# Patient Record
Sex: Female | Born: 1980 | Race: White | Hispanic: No | Marital: Married | Smoking: Never smoker
Health system: Southern US, Community
[De-identification: ages and names within clinical notes are randomized; demographics above are authoritative.]

## PROBLEM LIST (undated history)

## (undated) ENCOUNTER — Inpatient Hospital Stay (HOSPITAL_COMMUNITY): Payer: Self-pay

## (undated) DIAGNOSIS — D069 Carcinoma in situ of cervix, unspecified: Secondary | ICD-10-CM

## (undated) DIAGNOSIS — Z3490 Encounter for supervision of normal pregnancy, unspecified, unspecified trimester: Secondary | ICD-10-CM

## (undated) DIAGNOSIS — R42 Dizziness and giddiness: Secondary | ICD-10-CM

## (undated) DIAGNOSIS — N9489 Other specified conditions associated with female genital organs and menstrual cycle: Secondary | ICD-10-CM

## (undated) DIAGNOSIS — K819 Cholecystitis, unspecified: Secondary | ICD-10-CM

## (undated) DIAGNOSIS — Z8619 Personal history of other infectious and parasitic diseases: Secondary | ICD-10-CM

## (undated) DIAGNOSIS — D649 Anemia, unspecified: Secondary | ICD-10-CM

## (undated) DIAGNOSIS — F418 Other specified anxiety disorders: Secondary | ICD-10-CM

## (undated) DIAGNOSIS — R87629 Unspecified abnormal cytological findings in specimens from vagina: Secondary | ICD-10-CM

## (undated) HISTORY — DX: Unspecified abnormal cytological findings in specimens from vagina: R87.629

## (undated) HISTORY — DX: Personal history of other infectious and parasitic diseases: Z86.19

## (undated) HISTORY — DX: Other specified conditions associated with female genital organs and menstrual cycle: N94.89

## (undated) HISTORY — PX: LEEP: SHX91

## (undated) HISTORY — DX: Carcinoma in situ of cervix, unspecified: D06.9

---

## 2003-06-24 ENCOUNTER — Other Ambulatory Visit: Admission: RE | Admit: 2003-06-24 | Discharge: 2003-06-24 | Payer: Self-pay | Admitting: Family Medicine

## 2003-08-24 ENCOUNTER — Emergency Department (HOSPITAL_COMMUNITY): Admission: EM | Admit: 2003-08-24 | Discharge: 2003-08-24 | Payer: Self-pay | Admitting: Emergency Medicine

## 2004-07-01 ENCOUNTER — Other Ambulatory Visit: Admission: RE | Admit: 2004-07-01 | Discharge: 2004-07-01 | Payer: Self-pay | Admitting: Family Medicine

## 2005-04-02 ENCOUNTER — Encounter (INDEPENDENT_AMBULATORY_CARE_PROVIDER_SITE_OTHER): Payer: Self-pay | Admitting: *Deleted

## 2005-04-02 ENCOUNTER — Ambulatory Visit (HOSPITAL_COMMUNITY): Admission: RE | Admit: 2005-04-02 | Discharge: 2005-04-02 | Payer: Self-pay | Admitting: Gastroenterology

## 2005-07-02 ENCOUNTER — Other Ambulatory Visit: Admission: RE | Admit: 2005-07-02 | Discharge: 2005-07-02 | Payer: Self-pay | Admitting: Family Medicine

## 2006-08-14 ENCOUNTER — Ambulatory Visit (HOSPITAL_COMMUNITY): Admission: RE | Admit: 2006-08-14 | Discharge: 2006-08-14 | Payer: Self-pay | Admitting: *Deleted

## 2006-08-16 DIAGNOSIS — Z3490 Encounter for supervision of normal pregnancy, unspecified, unspecified trimester: Secondary | ICD-10-CM

## 2006-08-16 HISTORY — DX: Encounter for supervision of normal pregnancy, unspecified, unspecified trimester: Z34.90

## 2006-08-16 HISTORY — PX: OTHER SURGICAL HISTORY: SHX169

## 2007-01-17 ENCOUNTER — Encounter: Admission: RE | Admit: 2007-01-17 | Discharge: 2007-01-17 | Payer: Self-pay | Admitting: Obstetrics and Gynecology

## 2007-03-23 ENCOUNTER — Inpatient Hospital Stay (HOSPITAL_COMMUNITY): Admission: AD | Admit: 2007-03-23 | Discharge: 2007-03-25 | Payer: Self-pay | Admitting: Obstetrics and Gynecology

## 2010-10-27 ENCOUNTER — Other Ambulatory Visit: Payer: Self-pay | Admitting: Obstetrics and Gynecology

## 2011-01-01 NOTE — Op Note (Signed)
NAMEUNKNOWN, FLANNIGAN NO.:  [id number]   MEDICAL RECORD NO.:  [id number]          PATIENT TYPE:  INP   LOCATION:  9128                          FACILITY:  [facility]   PHYSICIAN:  Lenoard Aden, M.D.DATE OF BIRTH:  Aug 21, 1980   DATE OF PROCEDURE:  03/23/2007  DATE OF DISCHARGE:                               OPERATIVE REPORT   INDICATION FOR OPERATIVE DELIVERY:  Maternal exhaustion.   POSTOPERATIVE DIAGNOSIS:  Maternal exhaustion.   PROCEDURE PERFORMED:  Outlet vacuum assisted vaginal delivery with a  Kiwi cup.   SURGEON:  Olivia Mackie, M.D.   ANESTHESIA:  Epidural.   ESTIMATED BLOOD LOSS:  800 cc.   COMPLICATIONS:  None.   DRAINS:  None.   COUNTS:  Correct.   CONDITION:  The patient and the baby were recovering in good condition.   DESCRIPTION OF PROCEDURE:  After being apprised of the risks and  benefits of vacuum assistance to include a small incidence of  cephalohematoma, scalp laceration, and intracranial hemorrhage, a Kiwi  cup was placed on the fetal vertex straight AO, +4 station, 4-3 poles,  no pop offs.  The patient was delivered of a full term living female  over a second degree midline laceration, Apgars of 8 and 9.  The cord  blood was collected.  The placenta was delivered spontaneously intact,  three vessel cord noted.  The cervix was without lacerations.  A second  degree midline laceration was repaired using a 3-0 Vicryl Rapide in a  standard fashion.  No cervical lacerations were noted.  An estimated blood loss of 800 cc was noted with intermittent uterine  atony noted, 40 units of pitocin placed in the mixed IV bag solution.   The patient is recovering in good condition.      Lenoard Aden, M.D.  Electronically Signed     RJT/MEDQ  D:  03/23/2007  T:  03/23/2007  Job:  [id number]   cc:   Lenoard Aden, M.D.  Fax: [phone]

## 2011-01-01 NOTE — H&P (Signed)
NAMEMAKAYLIN, Taylor Luna NO.:  [id number]   MEDICAL RECORD NO.:  [id number]          PATIENT TYPE:  INP   LOCATION:  9175                          FACILITY:  [facility]   PHYSICIAN:  Lenoard Aden, Taylor.D.DATE OF BIRTH:  Jun 26, 1981   DATE OF ADMISSION:  03/23/2007  DATE OF DISCHARGE:                              HISTORY & PHYSICAL   INDICATIONS FOR INDUCTION:  SGA with grade 3 placenta and borderline  oligohydramnios. She is a 30 year old, white female, G1, P0 at [redacted] weeks  gestation with the aforementioned issues for induction.   ALLERGIES:  She has allergies to ERYTHROMYCIN.   MEDICATIONS:  Prenatal vitamins.   She is currently a nonsmoker, nondrinker. She denies domestic or  physical violence.   Her previous history is remarkable for oral surgery, history of abnormal  Pap smear for need of colposcopy post partum.   She has a family history of diabetes, hypertension, lupus, lung cancer  and nicotine dependence.   PHYSICAL EXAMINATION:  GENERAL:  She is a well-developed, well-  nourished, white female in no acute distress.  HEENT:  Normal.  LUNGS:  Clear.  HEART:  Regular rhythm.  ABDOMEN:  Soft, gravid and nontender. Estimated fetal weight is 6  pounds. Cervix is 2-3 cm, 70%, vertex -1.  EXTREMITIES:  Reveal no cords.  NEUROLOGIC:  Nonfocal.   IMPRESSION:  1. 39-week intrauterine pregnancy.  2. History of abnormal 1-hour GTT with inability to tolerate 3-hour      GTT with normal fasting and 2-hour post prandials checked.  3. GBS positive.  4. Rh negative.  5. Borderline pelvimetry.   PLAN:  Cautious attempts at vaginal delivery.     Lenoard Aden, Taylor.D.  Electronically Signed    RJT/MEDQ  D:  03/23/2007  T:  03/23/2007  Job:  [id number]

## 2011-05-31 LAB — RH IMMUNE GLOB WKUP(>/=20WKS)(NOT WOMEN'S HOSP): Fetal Screen: NEGATIVE

## 2011-05-31 LAB — RPR: RPR Ser Ql: NONREACTIVE

## 2011-05-31 LAB — CBC
HCT: 25.4 — ABNORMAL LOW
HCT: 33.7 — ABNORMAL LOW
Hemoglobin: 11.7 — ABNORMAL LOW
Hemoglobin: 8.9 — ABNORMAL LOW
MCHC: 34.7
MCHC: 34.9
MCV: 100.6 — ABNORMAL HIGH
MCV: 98.9
Platelets: 211
Platelets: 258
RBC: 2.53 — ABNORMAL LOW
RBC: 3.41 — ABNORMAL LOW
RDW: 13.2
RDW: 13.3
WBC: 10.2
WBC: 21.4 — ABNORMAL HIGH

## 2011-05-31 LAB — CCBB MATERNAL DONOR DRAW

## 2011-11-23 ENCOUNTER — Encounter (HOSPITAL_BASED_OUTPATIENT_CLINIC_OR_DEPARTMENT_OTHER): Payer: Self-pay | Admitting: *Deleted

## 2011-11-23 ENCOUNTER — Emergency Department (HOSPITAL_BASED_OUTPATIENT_CLINIC_OR_DEPARTMENT_OTHER)
Admission: EM | Admit: 2011-11-23 | Discharge: 2011-11-23 | Disposition: A | Discharge status: Home / Self Care | Payer: BC Managed Care – PPO | Attending: Emergency Medicine | Admitting: Emergency Medicine

## 2011-11-23 ENCOUNTER — Emergency Department (INDEPENDENT_AMBULATORY_CARE_PROVIDER_SITE_OTHER): Payer: BC Managed Care – PPO

## 2011-11-23 DIAGNOSIS — R51 Headache: Secondary | ICD-10-CM | POA: Insufficient documentation

## 2011-11-23 DIAGNOSIS — R110 Nausea: Secondary | ICD-10-CM | POA: Insufficient documentation

## 2011-11-23 DIAGNOSIS — F341 Dysthymic disorder: Secondary | ICD-10-CM | POA: Insufficient documentation

## 2011-11-23 DIAGNOSIS — R42 Dizziness and giddiness: Secondary | ICD-10-CM

## 2011-11-23 HISTORY — DX: Dizziness and giddiness: R42

## 2011-11-23 HISTORY — DX: Other specified anxiety disorders: F41.8

## 2011-11-23 LAB — PREGNANCY, URINE: Preg Test, Ur: NEGATIVE

## 2011-11-23 MED ORDER — MECLIZINE HCL 25 MG PO TABS
25.0000 mg | ORAL_TABLET | Freq: Once | ORAL | Status: AC
  Administered 2011-11-23: 25 mg via ORAL
  Filled 2011-11-23: qty 1

## 2011-11-23 NOTE — Discharge Instructions (Signed)
Dizziness Dizziness is a common problem. It is a feeling of unsteadiness or lightheadedness. You may feel like you are about to faint. Dizziness can lead to injury if you stumble or fall. A person of any age group can suffer from dizziness, but dizziness is more common in older adults. CAUSES  Dizziness can be caused by many different things, including:  Middle ear problems.   Standing for too long.   Infections.   An allergic reaction.   Aging.   An emotional response to something, such as the sight of blood.   Side effects of medicines.   Fatigue.   Problems with circulation or blood pressure.   Excess use of alcohol, medicines, or illegal drug use.   Breathing too fast (hyperventilation).   An arrhythmia or problems with your heart rhythm.   Low red blood cell count (anemia).   Pregnancy.   Vomiting, diarrhea, fever, or other illnesses that cause dehydration.   Diseases or conditions such as Parkinson's disease, high blood pressure (hypertension), diabetes, and thyroid problems.   Exposure to extreme heat.  DIAGNOSIS  To find the cause of your dizziness, your caregiver may do a physical exam, lab tests, radiologic imaging scans, or an electrocardiography test (ECG).  TREATMENT  Treatment of dizziness depends on the cause of your symptoms and can vary greatly. HOME CARE INSTRUCTIONS   Drink enough fluids to keep your urine clear or pale yellow. This is especially important in very hot weather. In the elderly, it is also important in cold weather.   If your dizziness is caused by medicines, take them exactly as directed. When taking blood pressure medicines, it is especially important to get up slowly.   Rise slowly from chairs and steady yourself until you feel okay.   In the morning, first sit up on the side of the bed. When this seems okay, stand slowly while holding onto something until you know your balance is fine.   If you need to stand in one place for a  long time, be sure to move your legs often. Tighten and relax the muscles in your legs while standing.   If dizziness continues to be a problem, have someone stay with you for a day or two. Do this until you feel you are well enough to stay alone. Have the person call your caregiver if he or she notices changes in you that are concerning.   Do not drive or use heavy machinery if you feel dizzy.  SEEK IMMEDIATE MEDICAL CARE IF:   Your dizziness or lightheadedness gets worse.   You feel nauseous or vomit.   You develop problems with talking, walking, weakness, or using your arms, hands, or legs.   You are not thinking clearly or you have difficulty forming sentences. It may take a friend or family member to determine if your thinking is normal.   You develop chest pain, abdominal pain, shortness of breath, or sweating.   Your vision changes.   You notice any bleeding.   You have side effects from medicine that seems to be getting worse rather than better.  MAKE SURE YOU:   Understand these instructions.   Will watch your condition.   Will get help right away if you are not doing well or get worse.  Document Released: 01/26/2001 Document Revised: 07/22/2011 Document Reviewed: 02/19/2011 [facility] Patient Information 2012 [place].  Headache, General, Unknown Cause The specific cause of your headache may not have been found today. There are many  causes and types of headache. A few common ones are:  Tension headache.   Migraine.   Infections (examples: dental and sinus infections).   Bone and/or joint problems in the neck or jaw.   Depression.   Eye problems.  These headaches are not life threatening.  Headaches can sometimes be diagnosed by a patient history and a physical exam. Sometimes, lab and imaging studies (such as x-ray and/or CT scan) are used to rule out more serious problems. In some cases, a spinal tap (lumbar puncture) may be requested. There are many  times when your exam and tests may be normal on the first visit even when there is a serious problem causing your headaches. Because of that, it is very important to follow up with your doctor or local clinic for further evaluation. FINDING OUT THE RESULTS OF TESTS  If a radiology test was performed, a radiologist will review your results.   You will be contacted by the emergency department or your physician if any test results require a change in your treatment plan.   Not all test results may be available during your visit. If your test results are not back during the visit, make an appointment with your caregiver to find out the results. Do not assume everything is normal if you have not heard from your caregiver or the medical facility. It is important for you to follow up on all of your test results.  HOME CARE INSTRUCTIONS   Keep follow-up appointments with your caregiver, or any specialist referral.   Only take over-the-counter or prescription medicines for pain, discomfort, or fever as directed by your caregiver.   Biofeedback, massage, or other relaxation techniques may be helpful.   Ice packs or heat applied to the head and neck can be used. Do this three to four times per day, or as needed.   Call your doctor if you have any questions or concerns.   If you smoke, you should quit.  SEEK MEDICAL CARE IF:   You develop problems with medications prescribed.   You do not respond to or obtain relief from medications.   You have a change from the usual headache.   You develop nausea or vomiting.  SEEK IMMEDIATE MEDICAL CARE IF:   If your headache becomes severe.   You have an unexplained oral temperature above 102 F (38.9 C), or as your caregiver suggests.   You have a stiff neck.   You have loss of vision.   You have muscular weakness.   You have loss of muscular control.   You develop severe symptoms different from your first symptoms.   You start losing your  balance or have trouble walking.   You feel faint or pass out.  MAKE SURE YOU:   Understand these instructions.   Will watch your condition.   Will get help right away if you are not doing well or get worse.  Document Released: 08/02/2005 Document Revised: 07/22/2011 Document Reviewed: 03/21/2008 [facility] Patient Information 2012 [place], [place].  RESOURCE GUIDE  Dental Problems  Patients with Medicaid: [facility] [phone] [address]                                           [phone] [address]  [employer] Phone:  [phone]                                                   Phone:  [phone]  If unable to pay or uninsured, contact:  [facility] or [facility] to become qualified for the adult dental clinic.  Chronic Pain Problems Contact [facility]  [phone] Patients need to be referred by their primary care doctor.  Insufficient Money for Medicine Contact [employer] Way:  call "211" or [facility] [phone].  No Primary Care Doctor Call [facility]  [phone] Other agencies that provide inexpensive medical care    [facility]  [phone]    [facility]  [phone]    [facility]  [phone]    [facility]  [phone]    [facility]  [phone]    [address] [facility]  [phone]  Psychological Services [facility] Behavioral Health  [phone] [facility]  [phone] [facility] Mental Health   [phone] (emergency services [phone])  Abuse/Neglect [facility] Child Abuse Hotline 765-810-7651 [facility] Child Abuse Hotline 2542966204 (After Hours)  Emergency Shelter [facility] [phone]  Maternity Homes Room at the [place] [facility] [place] [phone] [place] [facility] Services [phone]  MRSA Hotline #:   (505)831-5636    [facility] Resources  [facility]  [employer]                            [facility] [address] [place]                     [address]         [address]  Blondell Reveal Phone:  [phone]                                  Phone:  [phone]                   Phone:  [phone]  [facility] Mental [facility] Phone:  [phone]  [facility] Child Abuse Hotline 984-295-4677 [phone] (After Hours)

## 2011-11-23 NOTE — ED Provider Notes (Signed)
History     CSN: [id number]  Arrival date & time 11/23/11  1055   First MD Initiated Contact with Patient 11/23/11 1133      Chief Complaint  Patient presents with  . Dizziness    (Consider location/radiation/quality/duration/timing/severity/associated sxs/prior treatment) HPI  30yoF states she woke up this morning with headache 0300 with vertigo type sx. Shortly thereafter began to have 8/10 sudden onset headache. C/O persistent frontal headache, currently 2/10. Persistent dizziness which is now lightheadedness. +Nausea while dizzy, now resolved. Denies fever +chills. No neck stiffness. No visual changes. Vertigo not worse with movement. H/o vertigo x one in past which resolved spontaneously.  Denies ear pain/problems with hearing. No trauma.   Lately more headaches than usual behind left eye with "trouble opening my eye" approx 2 weeks. She states she sometimes physically has to open Lt eye upon awakening. Denies night sweats, weight. Denies h/o brain aneurysm in self or family.   ED Notes, ED Provider Notes from 11/23/11 0000 to 11/23/11 11:12:34       Shela Commons, RN 11/23/2011 11:08      Patient states she was woke up this morning at 0300 with dizziness. States this is the worse dizziness she has ever had. Symptoms were associated with headache and nausea. States she has had intermittent headaches for the last several months that start behind her left eye, which have been associated with left eye opening problems.    Past Medical History  Diagnosis Date  . Dizziness   . Depression with anxiety     History reviewed. No pertinent past surgical history.  No family history on file.  History  Substance Use Topics  . Smoking status: Former Games developer  . Smokeless tobacco: Never Used  . Alcohol Use: No    OB History    Grav Para Term Preterm Abortions TAB SAB Ect Mult Living                  Review of Systems  All other systems reviewed and are negative.  Except as  noted HPI   Allergies  Erythrocin  Home Medications   Current Outpatient Rx  Name Route Sig Dispense Refill  . FLUOXETINE HCL 20 MG PO CAPS Oral Take 20 mg by mouth daily.    Marland Kitchen NORGESTIM-ETH ESTRAD TRIPHASIC 0.18/0.215/0.25 MG-25 MCG PO TABS Oral Take 1 tablet by mouth daily.      BP 99/64  Pulse 72  Temp(Src) 97.8 F (36.6 C) (Oral)  Resp 20  Ht 5\' 2"  (1.575 m)  Wt 96 lb (43.545 kg)  BMI 17.56 kg/m2  SpO2 98%  LMP 11/17/2011  Physical Exam  Nursing note and vitals reviewed. Constitutional: She is oriented to person, place, and time. She appears well-developed.  HENT:  Head: Atraumatic.  Mouth/Throat: Oropharynx is clear and moist.  Eyes: Conjunctivae and EOM are normal. Pupils are equal, round, and reactive to light. Right eye exhibits no discharge. Left eye exhibits no discharge. No scleral icterus.       No nystagmus  Neck: Normal range of motion. Neck supple.  Cardiovascular: Normal rate, regular rhythm, normal heart sounds and intact distal pulses.   Pulmonary/Chest: Effort normal and breath sounds normal. No respiratory distress. She has no wheezes. She has no rales.  Abdominal: Soft. She exhibits no distension. There is no tenderness. There is no rebound and no guarding.  Musculoskeletal: Normal range of motion.  Neurological: She is alert and oriented to person, place, and time. No cranial nerve  deficit. She exhibits normal muscle tone. Coordination normal.       Strength 5/5 all extremities No pronator drift No facial droop   Skin: Skin is warm and dry. No rash noted.  Psychiatric: She has a normal mood and affect.    ED Course  Procedures (including critical care time)   Labs Reviewed  PREGNANCY, URINE   Ct Head Wo Contrast  11/23/2011  *RADIOLOGY REPORT*  Clinical Data: Dizziness  CT HEAD WITHOUT CONTRAST  Technique:  Contiguous axial images were obtained from the base of the skull through the vertex without contrast.  Comparison: None.  Findings: No  skull fracture is noted.  Paranasal sinuses and mastoid air cells are unremarkable.  There is a right nasal septum deviation.  No intracranial hemorrhage, mass effect or midline shift.  No hydrocephalus.  The gray and white matter differentiation is preserved.  No acute infarction.  No mass lesion is noted on this unenhanced scan.  IMPRESSION: No acute intracranial abnormality.  Original Report Authenticated By: Natasha Mead, M.D.   1. Vertigo   2. Headache     MDM  Vertigo and sudden onset severe headache. Neuro intact. Largely resolved spontaneously prior to arrival. Unclear peripheral v/s central etiology. Do not suspect acute stroke although with her onset of severe headache, some concern for [facility]. Low suspicion infectious etiology. CT head unremarkable. Discussed with patients risks/benefits of lumbar puncture and she is declining. Mother in room and aware as well. Patient given strict precautions for return to the emergency department. She is ambulatory without dizziness. In regards to her intermittent left eye lid lag, she has been advised to follow up with your PMD or neurology. She may need advanced imaging including MRI for possible MS v/s other non-emergent cause.         Forbes Cellar, MD 11/23/11 1325

## 2011-11-23 NOTE — ED Notes (Signed)
Patient states she was woke up this morning at 0300 with dizziness.  States this is the worse dizziness she has ever had.  Symptoms were associated with headache and nausea.  States she has had intermittent headaches for the last several months that start behind her left eye, which have been associated with left eye opening problems.

## 2014-05-17 ENCOUNTER — Encounter (HOSPITAL_BASED_OUTPATIENT_CLINIC_OR_DEPARTMENT_OTHER): Payer: Self-pay | Admitting: Emergency Medicine

## 2014-05-17 ENCOUNTER — Emergency Department (HOSPITAL_BASED_OUTPATIENT_CLINIC_OR_DEPARTMENT_OTHER)
Admission: EM | Admit: 2014-05-17 | Discharge: 2014-05-17 | Disposition: A | Discharge status: Home / Self Care | Payer: BC Managed Care – PPO | Attending: Emergency Medicine | Admitting: Emergency Medicine

## 2014-05-17 ENCOUNTER — Emergency Department (HOSPITAL_BASED_OUTPATIENT_CLINIC_OR_DEPARTMENT_OTHER): Payer: BC Managed Care – PPO

## 2014-05-17 DIAGNOSIS — F341 Dysthymic disorder: Secondary | ICD-10-CM | POA: Insufficient documentation

## 2014-05-17 DIAGNOSIS — Z792 Long term (current) use of antibiotics: Secondary | ICD-10-CM | POA: Insufficient documentation

## 2014-05-17 DIAGNOSIS — B349 Viral infection, unspecified: Secondary | ICD-10-CM | POA: Diagnosis not present

## 2014-05-17 DIAGNOSIS — Z87891 Personal history of nicotine dependence: Secondary | ICD-10-CM | POA: Diagnosis not present

## 2014-05-17 DIAGNOSIS — J029 Acute pharyngitis, unspecified: Secondary | ICD-10-CM | POA: Diagnosis not present

## 2014-05-17 DIAGNOSIS — R079 Chest pain, unspecified: Secondary | ICD-10-CM | POA: Diagnosis not present

## 2014-05-17 DIAGNOSIS — Z79899 Other long term (current) drug therapy: Secondary | ICD-10-CM | POA: Insufficient documentation

## 2014-05-17 DIAGNOSIS — R1110 Vomiting, unspecified: Secondary | ICD-10-CM | POA: Diagnosis present

## 2014-05-17 DIAGNOSIS — K567 Ileus, unspecified: Secondary | ICD-10-CM | POA: Insufficient documentation

## 2014-05-17 DIAGNOSIS — Z3202 Encounter for pregnancy test, result negative: Secondary | ICD-10-CM | POA: Diagnosis not present

## 2014-05-17 DIAGNOSIS — M791 Myalgia: Secondary | ICD-10-CM | POA: Insufficient documentation

## 2014-05-17 DIAGNOSIS — R0602 Shortness of breath: Secondary | ICD-10-CM | POA: Diagnosis not present

## 2014-05-17 LAB — URINE MICROSCOPIC-ADD ON

## 2014-05-17 LAB — RAPID STREP SCREEN (MED CTR MEBANE ONLY): Streptococcus, Group A Screen (Direct): NEGATIVE

## 2014-05-17 LAB — URINALYSIS, ROUTINE W REFLEX MICROSCOPIC
Glucose, UA: NEGATIVE mg/dL
Hgb urine dipstick: NEGATIVE
Ketones, ur: >80 mg/dL — AB
Nitrite: NEGATIVE
Protein, ur: 30 mg/dL — AB
Specific Gravity, Urine: 1.028 (ref 1.005–1.030)
Urobilinogen, UA: 1 mg/dL (ref 0.0–1.0)
pH: 6 (ref 5.0–8.0)

## 2014-05-17 LAB — CBC WITH DIFFERENTIAL/PLATELET
Basophils Absolute: 0 10*3/uL (ref 0.0–0.1)
Basophils Relative: 0 % (ref 0–1)
Eosinophils Absolute: 0 10*3/uL (ref 0.0–0.7)
Eosinophils Relative: 0 % (ref 0–5)
HCT: 36 % (ref 36.0–46.0)
Hemoglobin: 12.8 g/dL (ref 12.0–15.0)
Lymphocytes Relative: 14 % (ref 12–46)
Lymphs Abs: 1.2 10*3/uL (ref 0.7–4.0)
MCH: 33.3 pg (ref 26.0–34.0)
MCHC: 35.6 g/dL (ref 30.0–36.0)
MCV: 93.8 fL (ref 78.0–100.0)
Monocytes Absolute: 0.5 10*3/uL (ref 0.1–1.0)
Monocytes Relative: 5 % (ref 3–12)
Neutro Abs: 6.8 10*3/uL (ref 1.7–7.7)
Neutrophils Relative %: 81 % — ABNORMAL HIGH (ref 43–77)
Platelets: 180 10*3/uL (ref 150–400)
RBC: 3.84 MIL/uL — ABNORMAL LOW (ref 3.87–5.11)
RDW: 11.6 % (ref 11.5–15.5)
WBC: 8.5 10*3/uL (ref 4.0–10.5)

## 2014-05-17 LAB — COMPREHENSIVE METABOLIC PANEL
ALT: 25 U/L (ref 0–35)
AST: 22 U/L (ref 0–37)
Albumin: 3.6 g/dL (ref 3.5–5.2)
Alkaline Phosphatase: 100 U/L (ref 39–117)
Anion gap: 18 — ABNORMAL HIGH (ref 5–15)
BUN: 9 mg/dL (ref 6–23)
CO2: 19 mEq/L (ref 19–32)
Calcium: 9.1 mg/dL (ref 8.4–10.5)
Chloride: 102 mEq/L (ref 96–112)
Creatinine, Ser: 0.6 mg/dL (ref 0.50–1.10)
GFR calc Af Amer: >90 mL/min (ref >90)
GFR calc non Af Amer: >90 mL/min (ref >90)
Glucose, Bld: 95 mg/dL (ref 70–99)
Potassium: 3.1 mEq/L — ABNORMAL LOW (ref 3.7–5.3)
Sodium: 139 mEq/L (ref 137–147)
Total Bilirubin: 0.6 mg/dL (ref 0.3–1.2)
Total Protein: 7.9 g/dL (ref 6.0–8.3)

## 2014-05-17 LAB — MONONUCLEOSIS SCREEN: Mono Screen: NEGATIVE

## 2014-05-17 LAB — PREGNANCY, URINE: Preg Test, Ur: NEGATIVE

## 2014-05-17 LAB — D-DIMER, QUANTITATIVE: D-Dimer, Quant: 0.36 ug/mL-FEU (ref 0.00–0.48)

## 2014-05-17 MED ORDER — SODIUM CHLORIDE 0.9 % IV BOLUS (SEPSIS)
1000.0000 mL | Freq: Once | INTRAVENOUS | Status: AC
  Administered 2014-05-17: 1000 mL via INTRAVENOUS

## 2014-05-17 MED ORDER — HYDROCODONE-ACETAMINOPHEN 7.5-325 MG/15ML PO SOLN: 10.0000 mL | Freq: Four times a day (QID) | ORAL | Status: DC | PRN

## 2014-05-17 MED ORDER — IBUPROFEN 800 MG PO TABS: 800.0000 mg | ORAL_TABLET | Freq: Three times a day (TID) | ORAL | Status: DC

## 2014-05-17 MED ORDER — ONDANSETRON 8 MG PO TBDP: ORAL_TABLET | ORAL | Status: DC

## 2014-05-17 MED ORDER — KETOROLAC TROMETHAMINE 30 MG/ML IJ SOLN
30.0000 mg | Freq: Once | INTRAMUSCULAR | Status: AC
  Administered 2014-05-17: 30 mg via INTRAVENOUS
  Filled 2014-05-17: qty 1

## 2014-05-17 NOTE — ED Notes (Signed)
C/o vomiting x 3 this am,  w sob when waking up,  No distress,  Has had left side pain off and on x 3 weeks,  Generalized body aches and swollen tonsils,  Has been seen for same is on antibiotics

## 2014-05-17 NOTE — ED Notes (Signed)
Pt reports that she awoke this am and felt sob, pt in nad during triage, no difficulties talking with me or family, no sob noted, no distress noted

## 2014-05-17 NOTE — ED Provider Notes (Signed)
CSN: [id number]     Arrival date & time 05/17/14  0535 History   First MD Initiated Contact with Patient 05/17/14 [phone]     Chief Complaint  Patient presents with  . Emesis     (Consider location/radiation/quality/duration/timing/severity/associated sxs/prior Treatment) Patient is a 33 y.o. female presenting with vomiting. The history is provided by the patient.  Emesis Severity:  Moderate Timing:  Intermittent Quality:  Stomach contents Progression:  Unchanged Chronicity:  New Recent urination:  Normal Relieved by:  Nothing Worsened by:  Nothing tried Ineffective treatments:  None tried Associated symptoms: myalgias and sore throat   Associated symptoms: no cough   Associated symptoms comment:  Rib pain off and on for several weeks seen by PMD and urgent care and started on antibiotics for tonsillitis Myalgias:    Location:  Generalized   Quality:  Aching   Severity:  Moderate   Onset quality:  Gradual   Timing:  Constant   Progression:  Unchanged Risk factors: no alcohol use   Complains of side pain over the left lower ribs off and on for weeks.  Was told by PMD she had a virus but no labs were done then saw urgent care who started her on antibiotics for tonsillitis but no strep was done.  No change in voice.  Today had 3 episodes of emesis.    Past Medical History  Diagnosis Date  . Dizziness   . Depression with anxiety    History reviewed. No pertinent past surgical history. History reviewed. No pertinent family history. History  Substance Use Topics  . Smoking status: Former Games developermoker  . Smokeless tobacco: Never Used  . Alcohol Use: No   OB History   Grav Para Term Preterm Abortions TAB SAB Ect Mult Living                 Review of Systems  Constitutional: Negative for fever.  HENT: Positive for sore throat. Negative for drooling, trouble swallowing and voice change.   Gastrointestinal: Positive for vomiting.  Musculoskeletal: Positive for myalgias.  All  other systems reviewed and are negative.     Allergies  Erythrocin  Home Medications   Prior to Admission medications   Medication Sig Start Date End Date Taking? Authorizing Provider  cefdinir (OMNICEF) 300 MG capsule Take 300 mg by mouth 2 (two) times daily.   Yes Historical Provider, MD  FLUoxetine (PROZAC) 20 MG capsule Take 20 mg by mouth daily.    Historical Provider, MD  Norgestimate-Ethinyl Estradiol Triphasic (ORTHO TRI-CYCLEN LO) 0.18/0.215/0.25 MG-25 MCG tablet Take 1 tablet by mouth daily.    Historical Provider, MD   BP 122/79  Pulse 102  Temp(Src) 99.7 F (37.6 C) (Oral)  Resp 18  Ht 5\' 3"  (1.6 m)  Wt 108 lb (48.988 kg)  BMI 19.14 kg/m2  SpO2 100%  LMP 05/03/2014 Physical Exam  Constitutional: She is oriented to person, place, and time. She appears well-developed and well-nourished. No distress.  HENT:  Head: Normocephalic and atraumatic.  Enlarged tonsils, red not touching symmetric mallempati class one, moist mucus membranes intact phonation no pain with displacement of the trachea  Eyes: Conjunctivae and EOM are normal. Pupils are equal, round, and reactive to light.  Neck: Normal range of motion. Neck supple. No tracheal deviation present.  Cardiovascular: Normal rate, regular rhythm and intact distal pulses.   Pulmonary/Chest: Effort normal and breath sounds normal. No stridor. No respiratory distress. She has no wheezes. She has no rales. She exhibits no tenderness.  Abdominal: Soft. Bowel sounds are normal. There is no tenderness. There is no rebound and no guarding.  Musculoskeletal: Normal range of motion. She exhibits no edema and no tenderness.  Lymphadenopathy:    She has no cervical adenopathy.  Neurological: She is alert and oriented to person, place, and time.  Skin: Skin is warm and dry. She is not diaphoretic.  Psychiatric: She has a normal mood and affect.    ED Course  Procedures (including critical care time) Labs Review Labs  Reviewed  URINALYSIS, ROUTINE W REFLEX MICROSCOPIC - Abnormal; Notable for the following:    Color, Urine AMBER (*)    APPearance TURBID (*)    Bilirubin Urine SMALL (*)    Ketones, ur >80 (*)    Protein, ur 30 (*)    Leukocytes, UA SMALL (*)    All other components within normal limits  CBC WITH DIFFERENTIAL - Abnormal; Notable for the following:    RBC 3.84 (*)    Neutrophils Relative % 81 (*)    All other components within normal limits  URINE MICROSCOPIC-ADD ON - Abnormal; Notable for the following:    Squamous Epithelial / LPF MANY (*)    Bacteria, UA MANY (*)    All other components within normal limits  RAPID STREP SCREEN  PREGNANCY, URINE  COMPREHENSIVE METABOLIC PANEL  MONONUCLEOSIS SCREEN  D-DIMER, QUANTITATIVE    Imaging Review No results found.   EKG Interpretation None      MDM   Final diagnoses:  None  Seen and appreciate nurses note but pain is pointed to as over the lower left ribs not the flank  Symptoms ongoing of rib pain for several weeks intermittently, with myalgias, rib pain, sore throat.  Symptoms are consistent with viral infection.  Patient is not eating and she should be have rehydrated the patient and advised aggressive hydration and increasing her diet.  Exam benign, labs normal and reassuring.  Negative monospot.  Ddimer negative in a low risk patient.  Strep negative but already on antibiotics, complete course.  Due to recurrent tonsillitis will refer to ENT for close follow up.    Will treat with pain medication and zofran ODT,  Push POs and follow up with [facility] this weekend.  Strict abdominal pain return precautions given  Janes Colegrove K Almer Bushey-Rasch, MD 05/17/14 [phone]

## 2014-05-17 NOTE — ED Notes (Addendum)
Pt developed sore throat and body chills on Monday was  has been evaluated by [facility] on tues and urgent care on wed for sore throat, body aches, pt was started on po antibiotics for "mono", started vomiting tonight and felt sob when she woke up this am

## 2014-05-17 NOTE — ED Notes (Signed)
Pt care assumed, pt sleeping, family at bedside.

## 2014-05-19 LAB — CULTURE, GROUP A STREP

## 2014-05-25 ENCOUNTER — Emergency Department (HOSPITAL_BASED_OUTPATIENT_CLINIC_OR_DEPARTMENT_OTHER): Payer: BC Managed Care – PPO

## 2014-05-25 ENCOUNTER — Emergency Department (HOSPITAL_BASED_OUTPATIENT_CLINIC_OR_DEPARTMENT_OTHER)
Admission: EM | Admit: 2014-05-25 | Discharge: 2014-05-25 | Disposition: A | Discharge status: Home / Self Care | Payer: BC Managed Care – PPO | Attending: Emergency Medicine | Admitting: Emergency Medicine

## 2014-05-25 ENCOUNTER — Encounter (HOSPITAL_BASED_OUTPATIENT_CLINIC_OR_DEPARTMENT_OTHER): Payer: Self-pay | Admitting: Emergency Medicine

## 2014-05-25 DIAGNOSIS — Z87891 Personal history of nicotine dependence: Secondary | ICD-10-CM | POA: Diagnosis not present

## 2014-05-25 DIAGNOSIS — R945 Abnormal results of liver function studies: Secondary | ICD-10-CM | POA: Insufficient documentation

## 2014-05-25 DIAGNOSIS — Z3202 Encounter for pregnancy test, result negative: Secondary | ICD-10-CM | POA: Diagnosis not present

## 2014-05-25 DIAGNOSIS — K8020 Calculus of gallbladder without cholecystitis without obstruction: Secondary | ICD-10-CM | POA: Insufficient documentation

## 2014-05-25 DIAGNOSIS — M549 Dorsalgia, unspecified: Secondary | ICD-10-CM | POA: Diagnosis not present

## 2014-05-25 DIAGNOSIS — Z79899 Other long term (current) drug therapy: Secondary | ICD-10-CM | POA: Insufficient documentation

## 2014-05-25 DIAGNOSIS — K5900 Constipation, unspecified: Secondary | ICD-10-CM | POA: Insufficient documentation

## 2014-05-25 DIAGNOSIS — F418 Other specified anxiety disorders: Secondary | ICD-10-CM | POA: Diagnosis not present

## 2014-05-25 DIAGNOSIS — R7989 Other specified abnormal findings of blood chemistry: Secondary | ICD-10-CM

## 2014-05-25 LAB — CBC WITH DIFFERENTIAL/PLATELET
BLASTS: 0 %
Band Neutrophils: 0 % (ref 0–10)
Basophils Absolute: 0.1 10*3/uL (ref 0.0–0.1)
Basophils Relative: 1 % (ref 0–1)
Eosinophils Absolute: 0 10*3/uL (ref 0.0–0.7)
Eosinophils Relative: 0 % (ref 0–5)
HEMATOCRIT: 36.1 % (ref 36.0–46.0)
Hemoglobin: 12.9 g/dL (ref 12.0–15.0)
Lymphocytes Relative: 10 % — ABNORMAL LOW (ref 12–46)
Lymphs Abs: 0.9 10*3/uL (ref 0.7–4.0)
MCH: 33.3 pg (ref 26.0–34.0)
MCHC: 35.7 g/dL (ref 30.0–36.0)
MCV: 93.3 fL (ref 78.0–100.0)
MONO ABS: 0.3 10*3/uL (ref 0.1–1.0)
MONOS PCT: 3 % (ref 3–12)
Metamyelocytes Relative: 0 %
Myelocytes: 0 %
NRBC: 0 /100{WBCs}
Neutro Abs: 7.9 10*3/uL — ABNORMAL HIGH (ref 1.7–7.7)
Neutrophils Relative %: 86 % — ABNORMAL HIGH (ref 43–77)
Platelets: 323 10*3/uL (ref 150–400)
Promyelocytes Absolute: 0 %
RBC: 3.87 MIL/uL (ref 3.87–5.11)
RDW: 11.3 % — AB (ref 11.5–15.5)
WBC: 9.2 10*3/uL (ref 4.0–10.5)

## 2014-05-25 LAB — COMPREHENSIVE METABOLIC PANEL
ALT: 170 U/L — ABNORMAL HIGH (ref 0–35)
AST: 68 U/L — ABNORMAL HIGH (ref 0–37)
Albumin: 3.7 g/dL (ref 3.5–5.2)
Alkaline Phosphatase: 124 U/L — ABNORMAL HIGH (ref 39–117)
Anion gap: 17 — ABNORMAL HIGH (ref 5–15)
BUN: 10 mg/dL (ref 6–23)
CALCIUM: 9.4 mg/dL (ref 8.4–10.5)
CO2: 20 meq/L (ref 19–32)
CREATININE: 0.7 mg/dL (ref 0.50–1.10)
Chloride: 102 mEq/L (ref 96–112)
GLUCOSE: 84 mg/dL (ref 70–99)
Potassium: 4 mEq/L (ref 3.7–5.3)
Sodium: 139 mEq/L (ref 137–147)
Total Bilirubin: 0.7 mg/dL (ref 0.3–1.2)
Total Protein: 8.1 g/dL (ref 6.0–8.3)

## 2014-05-25 LAB — URINALYSIS, ROUTINE W REFLEX MICROSCOPIC
Bilirubin Urine: NEGATIVE
GLUCOSE, UA: NEGATIVE mg/dL
HGB URINE DIPSTICK: NEGATIVE
Ketones, ur: >80 mg/dL — AB
Leukocytes, UA: NEGATIVE
Nitrite: NEGATIVE
PH: 7 (ref 5.0–8.0)
Protein, ur: NEGATIVE mg/dL
SPECIFIC GRAVITY, URINE: 1.02 (ref 1.005–1.030)
UROBILINOGEN UA: 1 mg/dL (ref 0.0–1.0)

## 2014-05-25 LAB — PREGNANCY, URINE: PREG TEST UR: NEGATIVE

## 2014-05-25 LAB — LIPASE, BLOOD: LIPASE: 45 U/L (ref 11–59)

## 2014-05-25 LAB — ACETAMINOPHEN LEVEL: Acetaminophen (Tylenol), Serum: <15 ug/mL (ref 10–30)

## 2014-05-25 LAB — D-DIMER, QUANTITATIVE: D-Dimer, Quant: 0.58 ug/mL-FEU — ABNORMAL HIGH (ref 0.00–0.48)

## 2014-05-25 LAB — MONONUCLEOSIS SCREEN: MONO SCREEN: NEGATIVE

## 2014-05-25 MED ORDER — MORPHINE SULFATE 2 MG/ML IJ SOLN
2.0000 mg | Freq: Once | INTRAMUSCULAR | Status: AC
  Administered 2014-05-25: 2 mg via INTRAVENOUS
  Filled 2014-05-25: qty 1

## 2014-05-25 MED ORDER — IOHEXOL 300 MG/ML  SOLN
25.0000 mL | Freq: Once | INTRAMUSCULAR | Status: AC | PRN
  Administered 2014-05-25: 25 mL via ORAL

## 2014-05-25 MED ORDER — SODIUM CHLORIDE 0.9 % IV BOLUS (SEPSIS)
1000.0000 mL | Freq: Once | INTRAVENOUS | Status: AC
  Administered 2014-05-25: 1000 mL via INTRAVENOUS

## 2014-05-25 MED ORDER — IOHEXOL 300 MG/ML  SOLN
100.0000 mL | Freq: Once | INTRAMUSCULAR | Status: AC | PRN
  Administered 2014-05-25: 100 mL via INTRAVENOUS

## 2014-05-25 MED ORDER — ONDANSETRON HCL 4 MG/2ML IJ SOLN
4.0000 mg | Freq: Once | INTRAMUSCULAR | Status: AC
  Administered 2014-05-25: 4 mg via INTRAVENOUS
  Filled 2014-05-25: qty 2

## 2014-05-25 MED ORDER — POLYETHYLENE GLYCOL 3350 17 G PO PACK: 17.0000 g | PACK | Freq: Every day | ORAL | Status: DC

## 2014-05-25 MED ORDER — ONDANSETRON HCL 4 MG PO TABS: 4.0000 mg | ORAL_TABLET | Freq: Four times a day (QID) | ORAL | Status: DC

## 2014-05-25 NOTE — Discharge Instructions (Signed)
Cholelithiasis Followup with your doctor next week for recheck of your liver function. Followup with the surgeons regarding your gallstones. Take the constipation medicine as prescribed. Return to the ED for new or worsening symptoms. Cholelithiasis (also called gallstones) is a form of gallbladder disease in which gallstones form in your gallbladder. The gallbladder is an organ that stores bile made in the liver, which helps digest fats. Gallstones begin as small crystals and slowly grow into stones. Gallstone pain occurs when the gallbladder spasms and a gallstone is blocking the duct. Pain can also occur when a stone passes out of the duct.  RISK FACTORS  Being female.   Having multiple pregnancies. Health care providers sometimes advise removing diseased gallbladders before future pregnancies.   Being obese.  Eating a diet heavy in fried foods and fat.   Being older than 60 years and increasing age.   Prolonged use of medicines containing female hormones.   Having diabetes mellitus.   Rapidly losing weight.   Having a family history of gallstones (heredity).  SYMPTOMS  Nausea.   Vomiting.  Abdominal pain.   Yellowing of the skin (jaundice).   Sudden pain. It may persist from several minutes to several hours.  Fever.   Tenderness to the touch. In some cases, when gallstones do not move into the bile duct, people have no pain or symptoms. These are called "silent" gallstones.  TREATMENT Silent gallstones do not need treatment. In severe cases, emergency surgery may be required. Options for treatment include:  Surgery to remove the gallbladder. This is the most common treatment.  Medicines. These do not always work and may take 6-12 months or more to work.  Shock wave treatment (extracorporeal biliary lithotripsy). In this treatment an ultrasound machine sends shock waves to the gallbladder to break gallstones into smaller pieces that can pass into the  intestines or be dissolved by medicine. HOME CARE INSTRUCTIONS   Only take over-the-counter or prescription medicines for pain, discomfort, or fever as directed by your health care provider.   Follow a low-fat diet until seen again by your health care provider. Fat causes the gallbladder to contract, which can result in pain.   Follow up with your health care provider as directed. Attacks are almost always recurrent and surgery is usually required for permanent treatment.  SEEK IMMEDIATE MEDICAL CARE IF:   Your pain increases and is not controlled by medicines.   You have a fever or persistent symptoms for more than 2-3 days.   You have a fever and your symptoms suddenly get worse.   You have persistent nausea and vomiting.  MAKE SURE YOU:   Understand these instructions.  Will watch your condition.  Will get help right away if you are not doing well or get worse. Document Released: 07/29/2005 Document Revised: 04/04/2013 Document Reviewed: 01/24/2013 [facility] Patient Information 2015 [place], [place]. This information is not intended to replace advice given to you by your health care provider. Make sure you discuss any questions you have with your health care provider.  Constipation Constipation is when a person has fewer than three bowel movements a week, has difficulty having a bowel movement, or has stools that are dry, hard, or larger than normal. As people grow older, constipation is more common. If you try to fix constipation with medicines that make you have a bowel movement (laxatives), the problem may get worse. Long-term laxative use may cause the muscles of the colon to become weak. A low-fiber diet, not taking in  enough fluids, and taking certain medicines may make constipation worse.  CAUSES   Certain medicines, such as antidepressants, pain medicine, iron supplements, antacids, and water pills.   Certain diseases, such as diabetes, irritable bowel syndrome  (IBS), thyroid disease, or depression.   Not drinking enough water.   Not eating enough fiber-rich foods.   Stress or travel.   Lack of physical activity or exercise.   Ignoring the urge to have a bowel movement.   Using laxatives too much.  SIGNS AND SYMPTOMS   Having fewer than three bowel movements a week.   Straining to have a bowel movement.   Having stools that are hard, dry, or larger than normal.   Feeling full or bloated.   Pain in the lower abdomen.   Not feeling relief after having a bowel movement.  DIAGNOSIS  Your health care provider will take a medical history and perform a physical exam. Further testing may be done for severe constipation. Some tests may include:  A barium enema X-ray to examine your rectum, colon, and, sometimes, your small intestine.   A sigmoidoscopy to examine your lower colon.   A colonoscopy to examine your entire colon. TREATMENT  Treatment will depend on the severity of your constipation and what is causing it. Some dietary treatments include drinking more fluids and eating more fiber-rich foods. Lifestyle treatments may include regular exercise. If these diet and lifestyle recommendations do not help, your health care provider may recommend taking over-the-counter laxative medicines to help you have bowel movements. Prescription medicines may be prescribed if over-the-counter medicines do not work.  HOME CARE INSTRUCTIONS   Eat foods that have a lot of fiber, such as fruits, vegetables, whole grains, and beans.  Limit foods high in fat and processed sugars, such as french fries, hamburgers, cookies, candies, and soda.   A fiber supplement may be added to your diet if you cannot get enough fiber from foods.   Drink enough fluids to keep your urine clear or pale yellow.   Exercise regularly or as directed by your health care provider.   Go to the restroom when you have the urge to go. Do not hold it.   Only  take over-the-counter or prescription medicines as directed by your health care provider. Do not take other medicines for constipation without talking to your health care provider first.  SEEK IMMEDIATE MEDICAL CARE IF:   You have bright red blood in your stool.   Your constipation lasts for more than 4 days or gets worse.   You have abdominal or rectal pain.   You have thin, pencil-like stools.   You have unexplained weight loss. MAKE SURE YOU:   Understand these instructions.  Will watch your condition.  Will get help right away if you are not doing well or get worse. Document Released: 04/30/2004 Document Revised: 08/07/2013 Document Reviewed: 05/14/2013 [facility] Patient Information 2015 [place], [place]. This information is not intended to replace advice given to you by your health care provider. Make sure you discuss any questions you have with your health care provider.

## 2014-05-25 NOTE — ED Provider Notes (Signed)
CSN: [id number]     Arrival date & time 05/25/14  1010 History  This chart was scribed for Taylor Octave, MD by Leone Payor, ED Scribe. This patient was seen in room MH11/MH11 and the patient's care was started 10:43 AM.    Chief Complaint  Patient presents with  . Constipation    The history is provided by the patient. No language interpreter was used.    HPI Comments: Taylor Luna is a 33 y.o. female who presents to the Emergency Department complaining of 9 days of constipation and no BM. She reports being able to pass gas and having the urge to have a BM but unable. She has tried using a glycerin suppository 2 days ago without relief. She has associated left sided abdominal discomfort which is worse with palpation. She has a history of constipation and but not this extensive; usually has BM 2-3 times per week. Patient states she has been seen multiple times in the last 2 weeks, once in the ED on 05/17/14 when she was diagnosed with recurrent tonsillitis and had imaging which showed ileus. She was most recently seen by [facility] and was directed here for further testing. She denies fever, vomiting, chest pain, SOB, vaginal bleeding, vaginal discharge. She denies history of abdominal surgeries.   Past Medical History  Diagnosis Date  . Dizziness   . Depression with anxiety    History reviewed. No pertinent past surgical history. No family history on file. History  Substance Use Topics  . Smoking status: Former Games developer  . Smokeless tobacco: Never Used  . Alcohol Use: No   OB History   Grav Para Term Preterm Abortions TAB SAB Ect Mult Living                 Review of Systems  A complete 10 system review of systems was obtained and all systems are negative except as noted in the HPI and PMH.    Allergies  Erythrocin; Erythromycin; and Cefdinir  Home Medications   Prior to Admission medications   Medication Sig Start Date End Date Taking? Authorizing Provider  cefdinir  (OMNICEF) 300 MG capsule Take 300 mg by mouth 2 (two) times daily.    Historical Provider, MD  FLUoxetine (PROZAC) 20 MG capsule Take 20 mg by mouth daily.    Historical Provider, MD  HYDROcodone-acetaminophen (HYCET) 7.5-325 mg/15 ml solution Take 10 mLs by mouth every 6 (six) hours as needed for severe pain. 05/17/14   April K Palumbo-Rasch, MD  ibuprofen (ADVIL,MOTRIN) 800 MG tablet Take 1 tablet (800 mg total) by mouth 3 (three) times daily. 05/17/14   April K Palumbo-Rasch, MD  Norgestimate-Ethinyl Estradiol Triphasic (ORTHO TRI-CYCLEN LO) 0.18/0.215/0.25 MG-25 MCG tablet Take 1 tablet by mouth daily.    Historical Provider, MD  ondansetron (ZOFRAN ODT) 8 MG disintegrating tablet 8mg  ODT q8 hours prn nausea 05/17/14   April K Palumbo-Rasch, MD  ondansetron (ZOFRAN) 4 MG tablet Take 1 tablet (4 mg total) by mouth every 6 (six) hours. 05/25/14   Taylor Octave, MD  polyethylene glycol [facility]) packet Take 17 g by mouth daily. 05/25/14   Taylor Octave, MD   BP 112/89  Pulse 68  Temp(Src) 98.1 F (36.7 C) (Oral)  Resp 18  SpO2 100%  LMP 05/03/2014 Physical Exam  Nursing note and vitals reviewed. Constitutional: She is oriented to person, place, and time. She appears well-developed and well-nourished. No distress.  HENT:  Head: Normocephalic and atraumatic.  Mouth/Throat: Oropharynx is clear and moist.  No oropharyngeal exudate.  Eyes: Conjunctivae and EOM are normal. Pupils are equal, round, and reactive to light.  Neck: Normal range of motion. Neck supple.  No meningismus.  Cardiovascular: Normal rate, regular rhythm, normal heart sounds and intact distal pulses.   No murmur heard. Pulmonary/Chest: Effort normal and breath sounds normal. No respiratory distress. She exhibits no tenderness.  Abdominal: Soft. There is tenderness (Periumbilical and LLQ tenderness). There is no rebound and no guarding.  Musculoskeletal: Normal range of motion. She exhibits tenderness. She exhibits no  edema.  Paraspinal T and L tenderness. No midline tenderness  5/5 strength in bilateral lower extremities. Ankle plantar and dorsiflexion intact. Great toe extension intact bilaterally. +2 DP and PT pulses. +2 patellar reflexes bilaterally. Normal gait.   Neurological: She is alert and oriented to person, place, and time. No cranial nerve deficit. She exhibits normal muscle tone. Coordination normal.  No ataxia on finger to nose bilaterally. No pronator drift. 5/5 strength throughout. CN 2-12 intact. Negative Romberg. Equal grip strength. Sensation intact. Gait is normal.   Skin: Skin is warm.  Psychiatric: She has a normal mood and affect. Her behavior is normal.    ED Course  Procedures (including critical care time)  DIAGNOSTIC STUDIES: Oxygen Saturation is 100% on RA, normal by my interpretation.    COORDINATION OF CARE: 10:49 AM Discussed treatment plan with pt at bedside and pt agreed to plan.   Labs Review Labs Reviewed  URINALYSIS, ROUTINE W REFLEX MICROSCOPIC - Abnormal; Notable for the following:    APPearance CLOUDY (*)    Ketones, ur >80 (*)    All other components within normal limits  CBC WITH DIFFERENTIAL - Abnormal; Notable for the following:    RDW 11.3 (*)    Neutrophils Relative % 86 (*)    Lymphocytes Relative 10 (*)    Neutro Abs 7.9 (*)    All other components within normal limits  COMPREHENSIVE METABOLIC PANEL - Abnormal; Notable for the following:    AST 68 (*)    ALT 170 (*)    Alkaline Phosphatase 124 (*)    Anion gap 17 (*)    All other components within normal limits  D-DIMER, QUANTITATIVE - Abnormal; Notable for the following:    D-Dimer, Quant 0.58 (*)    All other components within normal limits  PREGNANCY, URINE  LIPASE, BLOOD  MONONUCLEOSIS SCREEN  ACETAMINOPHEN LEVEL    Imaging Review Ct Angio Chest Pe W/cm &/or Wo Cm  05/25/2014   CLINICAL DATA:  Her back pain and constipation for 9 days. Elevated D-dimer assess for pulmonary  embolus.  EXAM: CT ANGIOGRAPHY CHEST AND CT of the abdomen and pelvis.  TECHNIQUE: Multidetector CT imaging of the chest was performed using the standard protocol during bolus administration of intravenous contrast. Multiplanar CT image reconstructions and MIPs were obtained to evaluate the vascular anatomy. CT of abdomen pelvis with intravenous and oral contrast are submitted.  CONTRAST:  25mL OMNIPAQUE IOHEXOL 300 MG/ML SOLN, 25mL OMNIPAQUE IOHEXOL 300 MG/ML SOLN, 100mL OMNIPAQUE IOHEXOL 300 MG/ML SOLN  COMPARISON:  None.  FINDINGS: CTA CHEST FINDINGS  There is no pulmonary embolus. The aorta is normal. There is no mediastinal or hilar lymphadenopathy. The heart size is normal. There is no pericardial effusion. There is no focal pneumonia or pleural effusion. No pulmonary mass or nodule are noted. No acute abnormality is identified within the visualized bones.  Review of the MIP images confirms the above findings.  CT ABDOMEN AND PELVIS FINDINGS  [phone] RutsEduard [phone] Done harm RingsKruppFerman HammingDuwayne [phone] [phone]  Santiago Glad Santiago Glad [facility] AbileneAida [phone] [place] RutsEduard [phone] Done Charm RingsLookebaFerman Hamming  C/o constipation x 9 days.  Passing flatus. No vomiting.  Recent pharyngitis.  No CP or SOB. Mid/low back pain x 2 days.  No urinary symptoms.  Mild elevation of LFTs. Lipase normal. APAP negative.  UA with ketones, no infection, IVFgiven. Ultrasound shows cholelithiasis without cholecystitis.  bilirubin normal. No right upper quadrant pain.  Back pain likely combination of constipation and possibly cholelithiasis. We'll treat constipation, increase fluid intake follow up with general surgery regarding gallstones. Patient encouraged to have recheck of liver function next week. Return precautions discussed.   BP 112/89  Pulse 68  Temp(Src) 98.1 F (36.7 C) (Oral)  Resp 18  SpO2 100%  LMP 05/03/2014   I personally performed the services described in this documentation, which was scribed in my presence. The recorded information has been reviewed and is accurate.   Taylor Octave, MD 05/25/14 Ernestina [place]

## 2014-05-25 NOTE — ED Notes (Signed)
Waiting on urine pregnancy test before doing x-ray of abdomen.  Thanks

## 2014-05-25 NOTE — ED Notes (Signed)
Patient has not had a BM in 9 days, was seen last week and xray showed small ileus. EDP gave pt home instructions, which pt states she has followed and nothing has worked.

## 2014-06-10 ENCOUNTER — Encounter (INDEPENDENT_AMBULATORY_CARE_PROVIDER_SITE_OTHER): Payer: Self-pay | Admitting: Surgery

## 2014-06-10 DIAGNOSIS — K5909 Other constipation: Secondary | ICD-10-CM | POA: Insufficient documentation

## 2015-07-30 IMAGING — CT CT ABD-PELV W/ CM
3 of 10 series · 13 of 46 positions shown, 18 images · IV contrast (APPLIED)
Comparison: None.

CLINICAL DATA: Her back pain and constipation for 9 days. Elevated
D-dimer assess for pulmonary embolus.

EXAM:
CT ANGIOGRAPHY CHEST AND CT of the abdomen and pelvis.
TECHNIQUE: Multidetector CT imaging of the chest was performed using the
standard protocol during bolus administration of intravenous
contrast. Multiplanar CT image reconstructions and MIPs were
obtained to evaluate the vascular anatomy. CT of abdomen pelvis with
intravenous and oral contrast are submitted.
CONTRAST:  25mL OMNIPAQUE IOHEXOL 300 MG/ML SOLN, 25mL OMNIPAQUE
IOHEXOL 300 MG/ML SOLN, 100mL OMNIPAQUE IOHEXOL 300 MG/ML SOLN

[Series 6: pe 1.0 b26f · axial · 0.63mm/px · z∈[-160,+71]mm · 8 of 267 slices shown]
[im 18/267  soft-tissue]
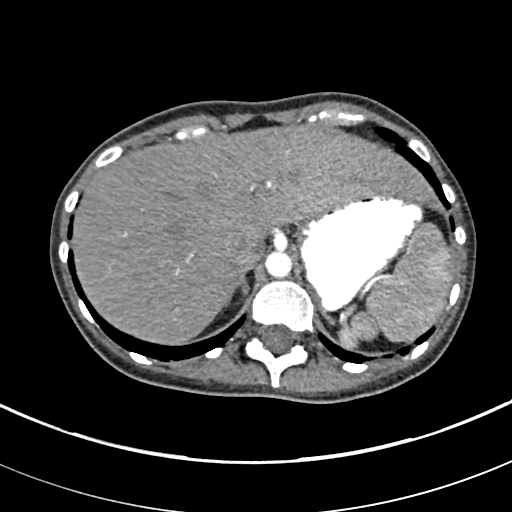
[im 54/267  soft-tissue]
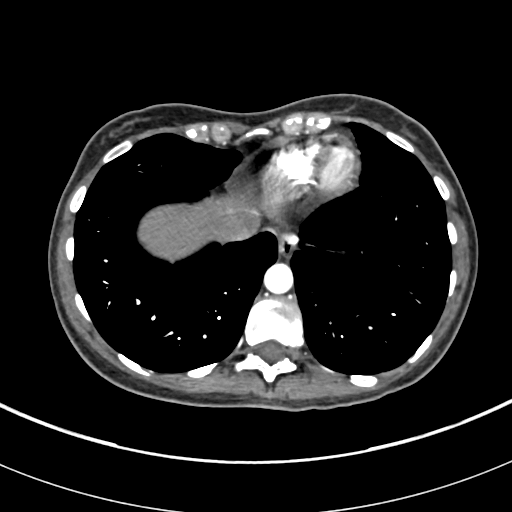
[im 89/267  soft-tissue]
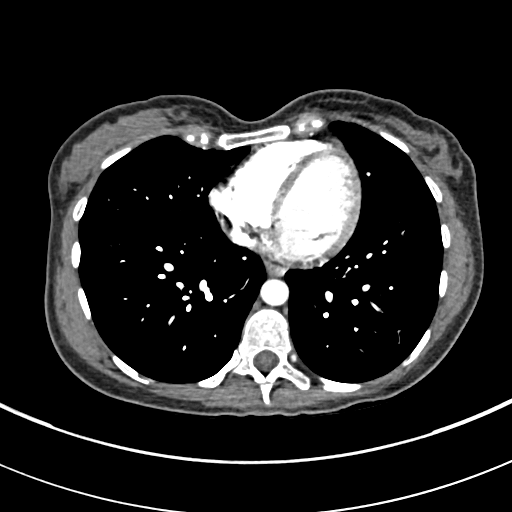
[im 125/267  soft-tissue]
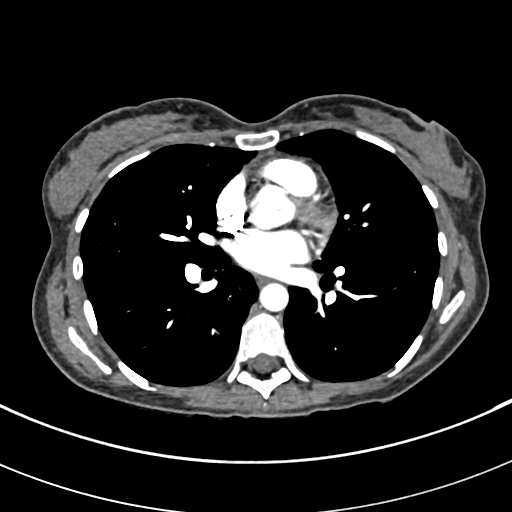
[im 142/267  soft-tissue]
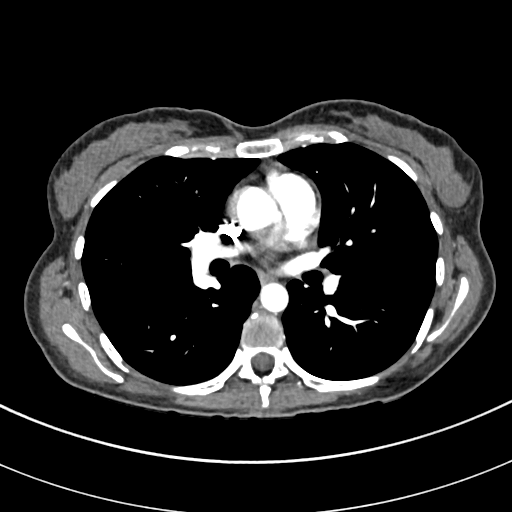
[im 178/267  soft-tissue]
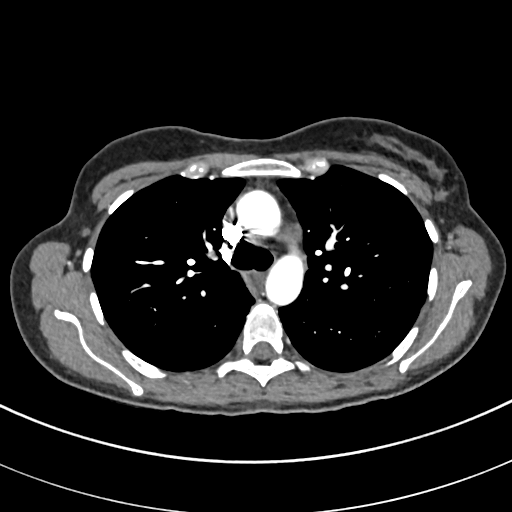
[im 213/267  soft-tissue]
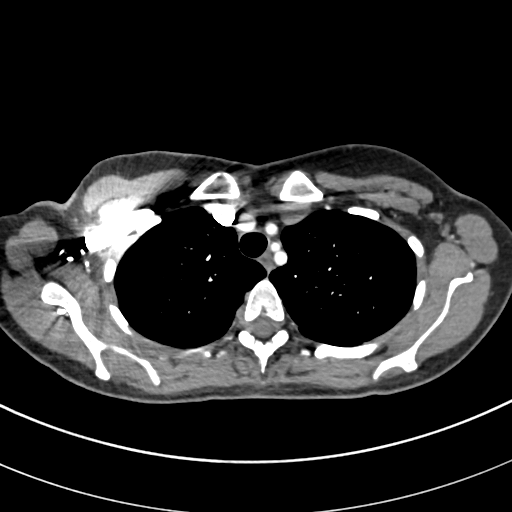
[im 249/267  soft-tissue]
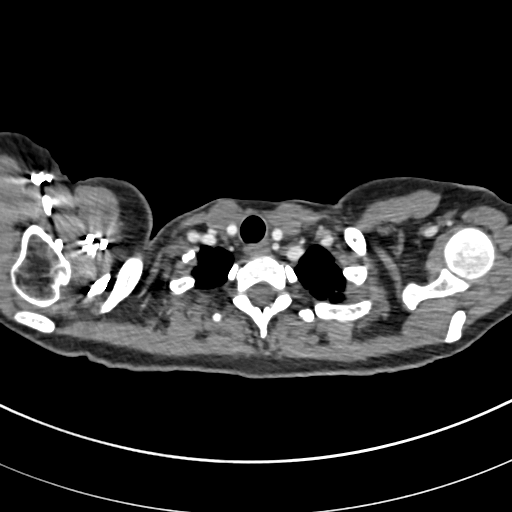

[Series 8: pe 2.0 coronal · coronal · 0.62mm/px · 2 of 92 slices shown, 3 images]
[im 31/92  soft-tissue]
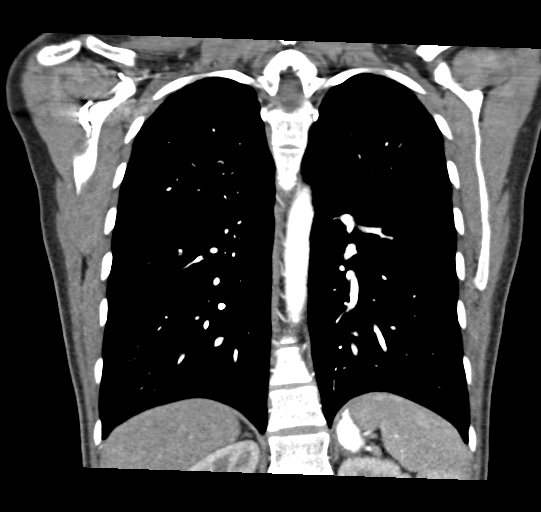
[im 31/92  bone]
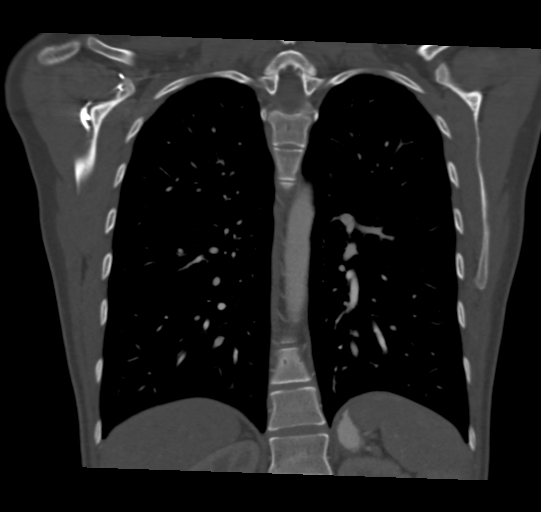
[im 61/92  soft-tissue]
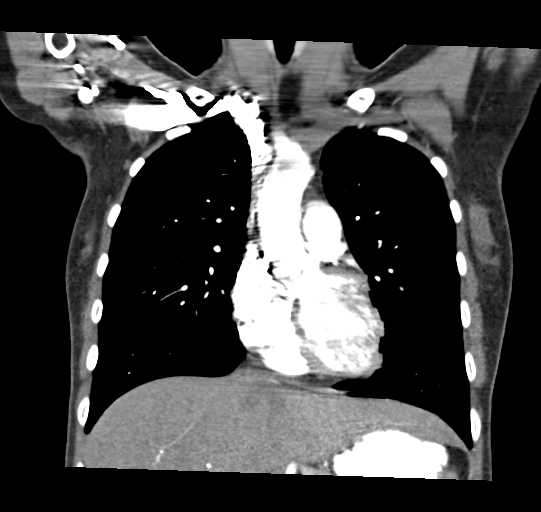

[Series 12: abd/pelvis 5.0 b31f · axial · 0.67mm/px · z∈[-409,-209]mm · 3 of 82 slices shown, 7 images]
[im 21/82  soft-tissue]
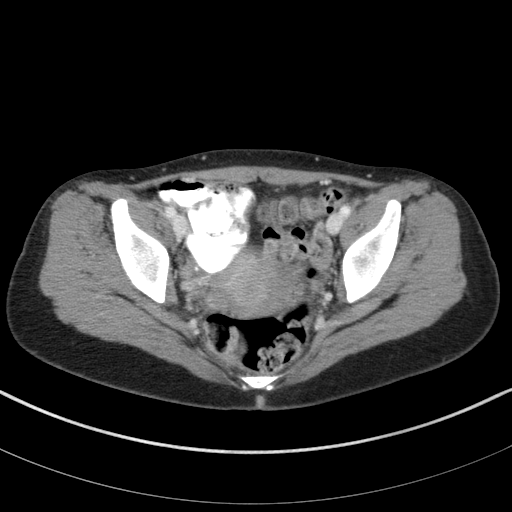
[im 21/82  lung]
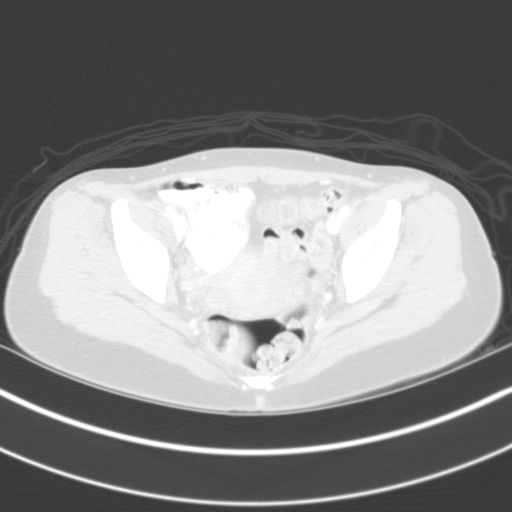
[im 21/82  bone]
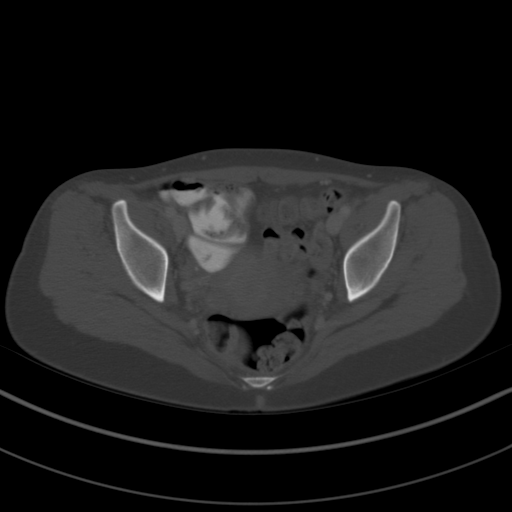
[im 41/82  soft-tissue]
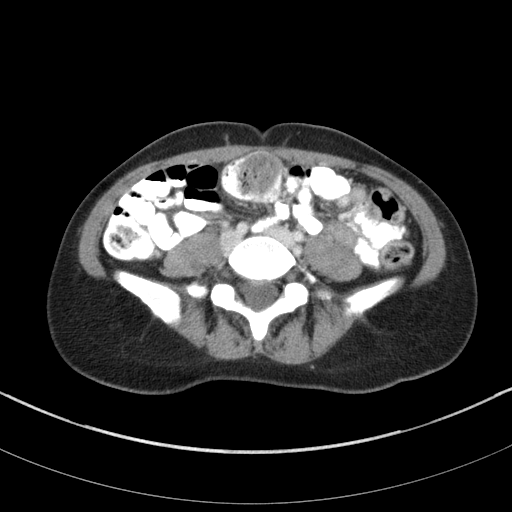
[im 41/82  lung]
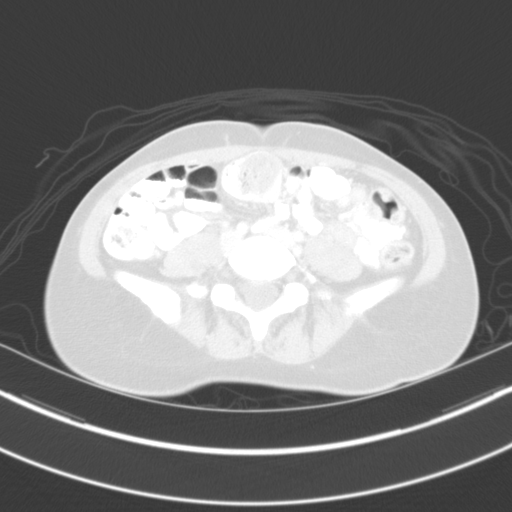
[im 61/82  soft-tissue]
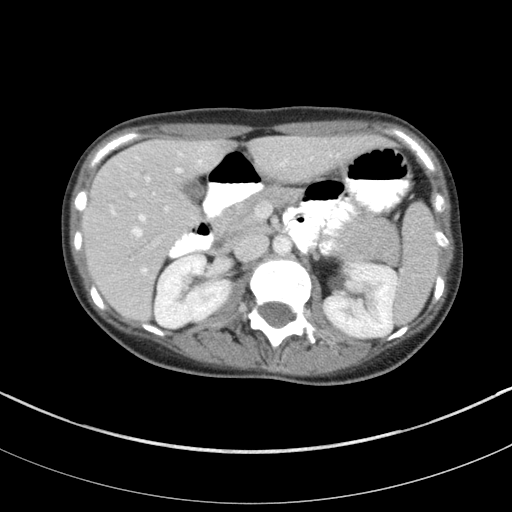
[im 61/82  lung]
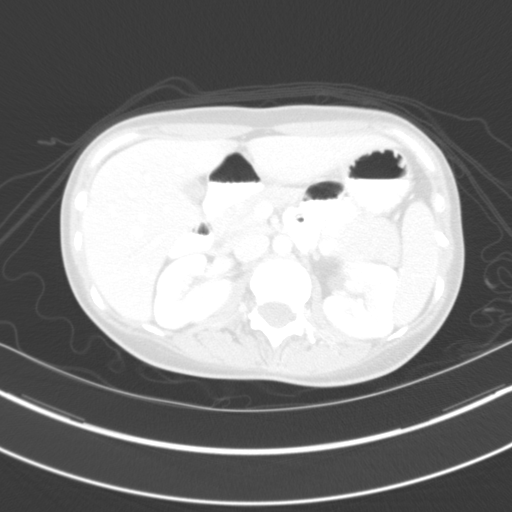

[13 of 46 positions shown; findings below may reference images not displayed]

FINDINGS: CTA CHEST FINDINGS

There is no pulmonary embolus. The aorta is normal. There is no
mediastinal or hilar lymphadenopathy. The heart size is normal.
There is no pericardial effusion. There is no focal pneumonia or
pleural effusion. No pulmonary mass or nodule are noted. No acute
abnormality is identified within the visualized bones.

Review of the MIP images confirms the above findings.

CT ABDOMEN AND PELVIS FINDINGS

The liver, spleen, pancreas, gallbladder, adrenal glands and kidneys
are normal. There is no hydronephrosis bilaterally. The aorta is
normal. There is no abdominal lymphadenopathy. There is no small
bowel obstruction. Extensive bowel content is noted throughout
colon.

Bladder is decompressed limiting evaluation. The uterus is normal.
There is scoliosis of spine. No acute abnormalities identified
within the visualized bones.
IMPRESSION: No pulmonary embolus. No acute abnormality identified within the
chest.

No acute abnormality identified in the abdomen and pelvis.
Constipation.

## 2017-12-04 ENCOUNTER — Encounter (HOSPITAL_BASED_OUTPATIENT_CLINIC_OR_DEPARTMENT_OTHER): Payer: Self-pay | Admitting: *Deleted

## 2017-12-04 ENCOUNTER — Emergency Department (HOSPITAL_BASED_OUTPATIENT_CLINIC_OR_DEPARTMENT_OTHER)
Admission: EM | Admit: 2017-12-04 | Discharge: 2017-12-04 | Disposition: A | Discharge status: Home / Self Care | Payer: BC Managed Care – PPO | Attending: Emergency Medicine | Admitting: Emergency Medicine

## 2017-12-04 ENCOUNTER — Other Ambulatory Visit: Payer: Self-pay

## 2017-12-04 DIAGNOSIS — R112 Nausea with vomiting, unspecified: Secondary | ICD-10-CM | POA: Diagnosis present

## 2017-12-04 DIAGNOSIS — Z79899 Other long term (current) drug therapy: Secondary | ICD-10-CM | POA: Diagnosis not present

## 2017-12-04 DIAGNOSIS — R8271 Bacteriuria: Secondary | ICD-10-CM | POA: Insufficient documentation

## 2017-12-04 DIAGNOSIS — E860 Dehydration: Secondary | ICD-10-CM | POA: Insufficient documentation

## 2017-12-04 DIAGNOSIS — Z87891 Personal history of nicotine dependence: Secondary | ICD-10-CM | POA: Diagnosis not present

## 2017-12-04 LAB — URINALYSIS, ROUTINE W REFLEX MICROSCOPIC
Bilirubin Urine: NEGATIVE
Glucose, UA: NEGATIVE mg/dL
Hgb urine dipstick: NEGATIVE
Nitrite: POSITIVE — AB
PROTEIN: NEGATIVE mg/dL
Specific Gravity, Urine: >1.03 — ABNORMAL HIGH (ref 1.005–1.030)
pH: 6 (ref 5.0–8.0)

## 2017-12-04 LAB — LIPASE, BLOOD: LIPASE: 33 U/L (ref 11–51)

## 2017-12-04 LAB — COMPREHENSIVE METABOLIC PANEL
ALK PHOS: 61 U/L (ref 38–126)
ALT: 12 U/L — ABNORMAL LOW (ref 14–54)
ANION GAP: 13 (ref 5–15)
AST: 19 U/L (ref 15–41)
Albumin: 4.1 g/dL (ref 3.5–5.0)
BILIRUBIN TOTAL: 0.4 mg/dL (ref 0.3–1.2)
BUN: 13 mg/dL (ref 6–20)
CALCIUM: 8.8 mg/dL — AB (ref 8.9–10.3)
CO2: 17 mmol/L — ABNORMAL LOW (ref 22–32)
Chloride: 109 mmol/L (ref 101–111)
Creatinine, Ser: 0.83 mg/dL (ref 0.44–1.00)
Glucose, Bld: 129 mg/dL — ABNORMAL HIGH (ref 65–99)
POTASSIUM: 3.3 mmol/L — AB (ref 3.5–5.1)
Sodium: 139 mmol/L (ref 135–145)
TOTAL PROTEIN: 7.4 g/dL (ref 6.5–8.1)

## 2017-12-04 LAB — URINALYSIS, MICROSCOPIC (REFLEX)

## 2017-12-04 LAB — CBC
HCT: 41.6 % (ref 36.0–46.0)
Hemoglobin: 15 g/dL (ref 12.0–15.0)
MCH: 33.9 pg (ref 26.0–34.0)
MCHC: 36.1 g/dL — ABNORMAL HIGH (ref 30.0–36.0)
MCV: 93.9 fL (ref 78.0–100.0)
Platelets: 213 10*3/uL (ref 150–400)
RBC: 4.43 MIL/uL (ref 3.87–5.11)
RDW: 11.7 % (ref 11.5–15.5)
WBC: 12.7 10*3/uL — AB (ref 4.0–10.5)

## 2017-12-04 LAB — PREGNANCY, URINE: PREG TEST UR: NEGATIVE

## 2017-12-04 MED ORDER — METOCLOPRAMIDE HCL 10 MG PO TABS: 10.0000 mg | ORAL_TABLET | Freq: Four times a day (QID) | ORAL | 0 refills | Status: DC | PRN

## 2017-12-04 MED ORDER — MORPHINE SULFATE (PF) 4 MG/ML IV SOLN
4.0000 mg | Freq: Once | INTRAVENOUS | Status: AC
  Administered 2017-12-04: 4 mg via INTRAVENOUS
  Filled 2017-12-04: qty 1

## 2017-12-04 MED ORDER — CIPROFLOXACIN HCL 500 MG PO TABS: 500.0000 mg | ORAL_TABLET | Freq: Two times a day (BID) | ORAL | 0 refills | Status: DC

## 2017-12-04 MED ORDER — SODIUM CHLORIDE 0.9 % IV BOLUS
1000.0000 mL | Freq: Once | INTRAVENOUS | Status: AC
  Administered 2017-12-04: 1000 mL via INTRAVENOUS

## 2017-12-04 MED ORDER — ONDANSETRON 8 MG PO TBDP: 8.0000 mg | ORAL_TABLET | Freq: Three times a day (TID) | ORAL | 0 refills | Status: DC | PRN

## 2017-12-04 MED ORDER — ONDANSETRON HCL 4 MG/2ML IJ SOLN
4.0000 mg | Freq: Once | INTRAMUSCULAR | Status: AC
  Administered 2017-12-04: 4 mg via INTRAVENOUS
  Filled 2017-12-04: qty 2

## 2017-12-04 MED ORDER — CIPROFLOXACIN IN D5W 400 MG/200ML IV SOLN
400.0000 mg | Freq: Once | INTRAVENOUS | Status: AC
  Administered 2017-12-04: 400 mg via INTRAVENOUS
  Filled 2017-12-04: qty 200

## 2017-12-04 MED ORDER — METOCLOPRAMIDE HCL 5 MG/ML IJ SOLN
10.0000 mg | Freq: Once | INTRAMUSCULAR | Status: AC
  Administered 2017-12-04: 10 mg via INTRAVENOUS
  Filled 2017-12-04: qty 2

## 2017-12-04 NOTE — ED Notes (Signed)
Pt ambulated to bathroom without difficulty.

## 2017-12-04 NOTE — ED Notes (Signed)
Pt given d/c instructions as per chart. Rx x 3. Verbalizes understanding. No questions. 

## 2017-12-04 NOTE — ED Notes (Signed)
Pt triaged by this RN 

## 2017-12-04 NOTE — ED Provider Notes (Signed)
[facility] EMERGENCY DEPARTMENT Provider Note   CSN: [id number] Arrival date & time: 12/04/17  0107     History   Chief Complaint Chief Complaint  Patient presents with  . Emesis    HPI Taylor Luna is a 37 y.o. female.  HPI Patient is a 37 year old female with a known history of gallstones who presents the emergency department with acute onset nausea vomiting.  She denies diarrhea.  No fevers or chills.  Denies dysuria or urinary frequency.  She states that she began vomiting this evening.  No blood in her vomit.  She reports some mild upper abdominal discomfort without right upper quadrant pain.  She states she was diagnosed with gallstones several years ago but is never had significant issues with biliary colic type symptoms.  No fevers or chills.  Symptoms are moderate in severity.  No back pain or flank pain.  No chest pain or shortness of breath.   Past Medical History:  Diagnosis Date  . Depression with anxiety   . Dizziness     Patient Active Problem List   Diagnosis Date Noted  . Chronic constipation 06/10/2014    History reviewed. No pertinent surgical history.   OB History   None      Home Medications    Prior to Admission medications   Medication Sig Start Date End Date Taking? Authorizing Provider  buPROPion (WELLBUTRIN XL) 300 MG 24 hr tablet Take 300 mg by mouth daily.   Yes [provider]  busPIRone (BUSPAR) 10 MG tablet Take 10 mg by mouth 2 (two) times daily.   Yes [provider]  mirtazapine (REMERON) 45 MG tablet Take 45 mg by mouth at bedtime.   Yes [provider]  ciprofloxacin (CIPRO) 500 MG tablet Take 1 tablet (500 mg total) by mouth 2 (two) times daily. 12/04/17   Azalia Bilis, MD  ibuprofen (ADVIL,MOTRIN) 800 MG tablet Take 1 tablet (800 mg total) by mouth 3 (three) times daily. 05/17/14   Palumbo, April, MD  metoCLOPramide (REGLAN) 10 MG tablet Take 1 tablet (10 mg total) by mouth every 6 (six)  hours as needed for nausea. 12/04/17   Azalia Bilis, MD  ondansetron (ZOFRAN ODT) 8 MG disintegrating tablet Take 1 tablet (8 mg total) by mouth every 8 (eight) hours as needed for nausea or vomiting. 12/04/17   Azalia Bilis, MD  ondansetron (ZOFRAN) 4 MG tablet Take 1 tablet (4 mg total) by mouth every 6 (six) hours. 05/25/14   Rancour, Jeannett Senior, MD  polyethylene glycol [facility]) packet Take 17 g by mouth daily. 05/25/14   [name], MD    Family History No family history on file.  Social History Social History   Tobacco Use  . Smoking status: Former Games developer  . Smokeless tobacco: Never Used  Substance Use Topics  . Alcohol use: No  . Drug use: No     Allergies   Erythrocin; Erythromycin; and Cefdinir   Review of Systems Review of Systems  All other systems reviewed and are negative.    Physical Exam Updated Vital Signs BP 106/73 (BP Location: Left Arm)   Pulse 89   Temp 98.7 F (37.1 C) (Oral)   Resp 18   Ht 5\' 4"  (1.626 m)   Wt 54.4 kg (120 lb)   LMP 11/13/2017 (Exact Date)   SpO2 99%   BMI 20.60 kg/m   Physical Exam  Constitutional: She is oriented to person, place, and time. She appears well-developed and well-nourished. No  distress.  HENT:  Head: Normocephalic and atraumatic.  Eyes: EOM are normal.  Neck: Normal range of motion.  Cardiovascular: Normal rate, regular rhythm and normal heart sounds.  Pulmonary/Chest: Effort normal and breath sounds normal.  Abdominal: Soft. She exhibits no distension. There is no tenderness.  Musculoskeletal: Normal range of motion.  Neurological: She is alert and oriented to person, place, and time.  Skin: Skin is warm and dry.  Psychiatric: She has a normal mood and affect. Judgment normal.  Nursing note and vitals reviewed.    ED Treatments / Results  Labs (all labs ordered are listed, but only abnormal results are displayed) Labs Reviewed  URINALYSIS, ROUTINE W REFLEX MICROSCOPIC - Abnormal; Notable for  the following components:      Result Value   APPearance CLOUDY (*)    Specific Gravity, Urine >1.030 (*)    Ketones, ur >80 (*)    Nitrite POSITIVE (*)    Leukocytes, UA TRACE (*)    All other components within normal limits  COMPREHENSIVE METABOLIC PANEL - Abnormal; Notable for the following components:   Potassium 3.3 (*)    CO2 17 (*)    Glucose, Bld 129 (*)    Calcium 8.8 (*)    ALT 12 (*)    All other components within normal limits  CBC - Abnormal; Notable for the following components:   WBC 12.7 (*)    MCHC 36.1 (*)    All other components within normal limits  URINALYSIS, MICROSCOPIC (REFLEX) - Abnormal; Notable for the following components:   Bacteria, UA MANY (*)    Squamous Epithelial / LPF 6-30 (*)    All other components within normal limits  URINE CULTURE  PREGNANCY, URINE  LIPASE, BLOOD    EKG None  Radiology No results found.  Procedures Procedures (including critical care time)  Medications Ordered in ED Medications  ondansetron (ZOFRAN) injection 4 mg (4 mg Intravenous Given 12/04/17 0133)  sodium chloride 0.9 % bolus 1,000 mL (0 mLs Intravenous Stopped 12/04/17 0246)  morphine 4 MG/ML injection 4 mg (4 mg Intravenous Given 12/04/17 0142)  metoCLOPramide (REGLAN) injection 10 mg (10 mg Intravenous Given 12/04/17 0335)  ciprofloxacin (CIPRO) IVPB 400 mg (0 mg Intravenous Stopped 12/04/17 0457)     Initial Impression / Assessment and Plan / ED Course  I have reviewed the triage vital signs and the nursing notes.  Pertinent labs & imaging results that were available during my care of the patient were reviewed by me and considered in my medical decision making (see chart for details).     No significant right upper quadrant tenderness.  Her main complaint is more nausea and vomiting.  She initially reported some improvement with Zofran but then reports that her nausea began to come back.  Her pain is resolved.  No diarrhea.  No sick  contacts.  Interestingly she does have nitrite positive urine.  She has no significant urinary symptoms however given her presentation I will cover her with antibiotics for possible UTI.  She understands that she will need to follow-up on the urine culture and if her urine culture grows out no bacteria she is to stop the antibiotics.  Bicarb is 17 noted this is likely secondary to dehydration  White blood cell count of 12.7 is nonspecific  No focal right upper quadrant tenderness at this time.  I do not believe this is a presentation of acute cholecystitis.  She could have had a component of biliary colic but it did  not sound classic.  Regardless she is asymptomatic now and feels much better.  She will be discharged home on antibiotics, Zofran, Reglan both as needed for vomiting.  She understands to return to the emergency department for new or worsening symptoms.  Final Clinical Impressions(s) / ED Diagnoses   Final diagnoses:  Non-intractable vomiting with nausea, unspecified vomiting type  Dehydration  Bacteriuria    ED Discharge Orders        Ordered    ondansetron (ZOFRAN ODT) 8 MG disintegrating tablet  Every 8 hours PRN     12/04/17 0438    metoCLOPramide (REGLAN) 10 MG tablet  Every 6 hours PRN     12/04/17 0438    ciprofloxacin (CIPRO) 500 MG tablet  2 times daily     12/04/17 [id number]       Azalia Bilisampos, Aleeza [place], MD 12/04/17 [phone]

## 2017-12-04 NOTE — Discharge Instructions (Signed)
Please follow up on the results of your urine culture  Discontinue the anitbiotics if the urine culture shows "no growth"

## 2017-12-04 NOTE — ED Notes (Signed)
ED Provider at bedside. 

## 2017-12-04 NOTE — ED Triage Notes (Signed)
Pt states she has been vomiting for a couple of hours. Bile noted in emesis bag. Also c/o epigastric tenderness. Tried to urinate earlier, but unable to do so. Denies vaginal d/c or other s/s. Last BM today.

## 2017-12-06 LAB — URINE CULTURE: Culture: >=100000 — AB

## 2017-12-07 ENCOUNTER — Telehealth: Payer: Self-pay | Admitting: Emergency Medicine

## 2017-12-07 NOTE — Telephone Encounter (Signed)
Post ED Visit - Positive Culture Follow-up  Culture report reviewed by antimicrobial stewardship pharmacist:  []  Enzo BiNathan Batchelder, Pharm.D. []  Celedonio MiyamotoJeremy Frens, Pharm.D., BCPS AQ-ID []  Garvin FilaMike Maccia, Pharm.D., BCPS []  Georgina PillionElizabeth Martin, [address]Pharm.D., BCPS []  BastropMinh Pham, Vermont[place]arm.D., BCPS, AAHIVP []  Estella HuskMichelle Turner, Pharm.D., BCPS, AAHIVP []  Lysle Pearlachel Rumbarger, PharmD, BCPS []  Blake DivineShannon Parkey, PharmD []  Pollyann SamplesAndy Johnston, PharmD, BCPS Sharin MonsEmily Sinclair PharmD  Positive urine culture Treated with ciprofloxacin, organism sensitive to the same and no further patient follow-up is required at this time.  Berle MullMiller, Taylor Luna, 1:03 PM

## 2018-08-16 NOTE — L&D Delivery Note (Signed)
Operative Delivery Note At 11:15 PM a viable and healthy female was delivered via Vaginal, Vacuum Neurosurgeon).  Presentation: vertex; Position: Right,, Occiput,, Anterior; Station: +4.  Fetal bradycardia noted. Verbal consent: obtained from patient.  Risks and benefits discussed in detail.  Risks include, but are not limited to the risks of anesthesia, bleeding, infection, damage to maternal tissues, fetal cephalhematoma.  There is also the risk of inability to effect vaginal delivery of the head, or shoulder dystocia that cannot be resolved by established maneuvers, leading to the need for emergency cesarean section.  APGAR: 8 ,9 ; weight pending .   Placenta status: spontaneous, intact.   Cord:  with the following complications: tight [place] x 2 reduced.  Cord pH: na  Anesthesia:  epidural Instruments: kiwi x 2 pulls Episiotomy: None Lacerations: None Suture Repair: na Est. Blood Loss (mL): 200  Mom to postpartum.  Baby to Couplet care / Skin to Skin.  Avril Busser J 05/15/2019, 11:24 PM

## 2018-09-16 DIAGNOSIS — K819 Cholecystitis, unspecified: Secondary | ICD-10-CM

## 2018-09-16 HISTORY — DX: Cholecystitis, unspecified: K81.9

## 2018-09-25 ENCOUNTER — Emergency Department (HOSPITAL_BASED_OUTPATIENT_CLINIC_OR_DEPARTMENT_OTHER)
Admission: EM | Admit: 2018-09-25 | Discharge: 2018-09-25 | Disposition: A | Discharge status: Home / Self Care | Payer: BC Managed Care – PPO | Attending: Emergency Medicine | Admitting: Emergency Medicine

## 2018-09-25 ENCOUNTER — Encounter (HOSPITAL_BASED_OUTPATIENT_CLINIC_OR_DEPARTMENT_OTHER): Payer: Self-pay

## 2018-09-25 ENCOUNTER — Emergency Department (HOSPITAL_BASED_OUTPATIENT_CLINIC_OR_DEPARTMENT_OTHER): Payer: BC Managed Care – PPO

## 2018-09-25 ENCOUNTER — Other Ambulatory Visit: Payer: Self-pay

## 2018-09-25 DIAGNOSIS — R6889 Other general symptoms and signs: Secondary | ICD-10-CM | POA: Diagnosis not present

## 2018-09-25 DIAGNOSIS — Z5321 Procedure and treatment not carried out due to patient leaving prior to being seen by health care provider: Secondary | ICD-10-CM | POA: Insufficient documentation

## 2018-09-25 NOTE — ED Triage Notes (Addendum)
Pt states she was dx with influenza B and an ear infection. Pt was started on Tamiflu and an antibiotic. Pt c/o worsening of pain in ear, sinus congestion, and ShOB. Pt took ibuprofen last at 20:00.

## 2018-09-25 NOTE — ED Notes (Signed)
Pt called x3 for room assignment with no response.

## 2018-10-10 ENCOUNTER — Encounter (HOSPITAL_BASED_OUTPATIENT_CLINIC_OR_DEPARTMENT_OTHER): Payer: Self-pay | Admitting: Emergency Medicine

## 2018-10-10 ENCOUNTER — Encounter (HOSPITAL_BASED_OUTPATIENT_CLINIC_OR_DEPARTMENT_OTHER): Payer: Self-pay | Admitting: *Deleted

## 2018-10-10 ENCOUNTER — Emergency Department (HOSPITAL_BASED_OUTPATIENT_CLINIC_OR_DEPARTMENT_OTHER)
Admission: EM | Admit: 2018-10-10 | Discharge: 2018-10-10 | Disposition: A | Discharge status: Home / Self Care | Payer: BC Managed Care – PPO | Source: Home / Self Care | Attending: Emergency Medicine | Admitting: Emergency Medicine

## 2018-10-10 ENCOUNTER — Ambulatory Visit (HOSPITAL_BASED_OUTPATIENT_CLINIC_OR_DEPARTMENT_OTHER)
Admission: RE | Admit: 2018-10-10 | Discharge: 2018-10-10 | Disposition: A | Discharge status: Home / Self Care | Payer: BC Managed Care – PPO | Source: Ambulatory Visit | Attending: Emergency Medicine | Admitting: Emergency Medicine

## 2018-10-10 ENCOUNTER — Encounter (HOSPITAL_BASED_OUTPATIENT_CLINIC_OR_DEPARTMENT_OTHER): Payer: Self-pay

## 2018-10-10 ENCOUNTER — Observation Stay (HOSPITAL_BASED_OUTPATIENT_CLINIC_OR_DEPARTMENT_OTHER)
Admission: EM | Admit: 2018-10-10 | Discharge: 2018-10-12 | Disposition: A | Discharge status: Home / Self Care | Payer: BC Managed Care – PPO | Attending: General Surgery | Admitting: General Surgery

## 2018-10-10 ENCOUNTER — Other Ambulatory Visit: Payer: Self-pay

## 2018-10-10 DIAGNOSIS — K8010 Calculus of gallbladder with chronic cholecystitis without obstruction: Secondary | ICD-10-CM | POA: Diagnosis not present

## 2018-10-10 DIAGNOSIS — Z3491 Encounter for supervision of normal pregnancy, unspecified, first trimester: Secondary | ICD-10-CM

## 2018-10-10 DIAGNOSIS — E876 Hypokalemia: Secondary | ICD-10-CM

## 2018-10-10 DIAGNOSIS — Z79899 Other long term (current) drug therapy: Secondary | ICD-10-CM

## 2018-10-10 DIAGNOSIS — Z3201 Encounter for pregnancy test, result positive: Secondary | ICD-10-CM | POA: Insufficient documentation

## 2018-10-10 DIAGNOSIS — R1012 Left upper quadrant pain: Secondary | ICD-10-CM | POA: Insufficient documentation

## 2018-10-10 DIAGNOSIS — Z888 Allergy status to other drugs, medicaments and biological substances status: Secondary | ICD-10-CM | POA: Diagnosis not present

## 2018-10-10 DIAGNOSIS — R1011 Right upper quadrant pain: Secondary | ICD-10-CM | POA: Insufficient documentation

## 2018-10-10 DIAGNOSIS — Z881 Allergy status to other antibiotic agents status: Secondary | ICD-10-CM | POA: Insufficient documentation

## 2018-10-10 DIAGNOSIS — K8020 Calculus of gallbladder without cholecystitis without obstruction: Secondary | ICD-10-CM | POA: Diagnosis present

## 2018-10-10 DIAGNOSIS — Z3A01 Less than 8 weeks gestation of pregnancy: Secondary | ICD-10-CM

## 2018-10-10 DIAGNOSIS — R52 Pain, unspecified: Secondary | ICD-10-CM

## 2018-10-10 DIAGNOSIS — O26611 Liver and biliary tract disorders in pregnancy, first trimester: Secondary | ICD-10-CM | POA: Insufficient documentation

## 2018-10-10 DIAGNOSIS — R1010 Upper abdominal pain, unspecified: Secondary | ICD-10-CM

## 2018-10-10 DIAGNOSIS — O9989 Other specified diseases and conditions complicating pregnancy, childbirth and the puerperium: Secondary | ICD-10-CM | POA: Insufficient documentation

## 2018-10-10 DIAGNOSIS — F418 Other specified anxiety disorders: Secondary | ICD-10-CM | POA: Insufficient documentation

## 2018-10-10 DIAGNOSIS — O99341 Other mental disorders complicating pregnancy, first trimester: Secondary | ICD-10-CM | POA: Diagnosis not present

## 2018-10-10 DIAGNOSIS — K810 Acute cholecystitis: Secondary | ICD-10-CM | POA: Diagnosis present

## 2018-10-10 DIAGNOSIS — O99281 Endocrine, nutritional and metabolic diseases complicating pregnancy, first trimester: Secondary | ICD-10-CM

## 2018-10-10 DIAGNOSIS — Z7722 Contact with and (suspected) exposure to environmental tobacco smoke (acute) (chronic): Secondary | ICD-10-CM | POA: Insufficient documentation

## 2018-10-10 DIAGNOSIS — K819 Cholecystitis, unspecified: Secondary | ICD-10-CM

## 2018-10-10 HISTORY — DX: Encounter for supervision of normal pregnancy, unspecified, unspecified trimester: Z34.90

## 2018-10-10 HISTORY — DX: Cholecystitis, unspecified: K81.9

## 2018-10-10 LAB — CBC WITH DIFFERENTIAL/PLATELET
ABS IMMATURE GRANULOCYTES: 0.03 10*3/uL (ref 0.00–0.07)
Abs Immature Granulocytes: 0.03 10*3/uL (ref 0.00–0.07)
BASOS PCT: 0 %
BASOS PCT: 0 %
Basophils Absolute: 0 10*3/uL (ref 0.0–0.1)
Basophils Absolute: 0 10*3/uL (ref 0.0–0.1)
EOS PCT: 0 %
Eosinophils Absolute: 0.1 10*3/uL (ref 0.0–0.5)
Eosinophils Absolute: 0 10*3/uL (ref 0.0–0.5)
Eosinophils Relative: 1 %
HCT: 37.8 % (ref 36.0–46.0)
HCT: 39.7 % (ref 36.0–46.0)
Hemoglobin: 12.9 g/dL (ref 12.0–15.0)
Hemoglobin: 13.5 g/dL (ref 12.0–15.0)
Immature Granulocytes: 0 %
Immature Granulocytes: 0 %
LYMPHS PCT: 13 %
Lymphocytes Relative: 19 %
Lymphs Abs: 1.8 10*3/uL (ref 0.7–4.0)
Lymphs Abs: 1.5 10*3/uL (ref 0.7–4.0)
MCH: 31.8 pg (ref 26.0–34.0)
MCH: 32.1 pg (ref 26.0–34.0)
MCHC: 34.1 g/dL (ref 30.0–36.0)
MCHC: 34 g/dL (ref 30.0–36.0)
MCV: 93.1 fL (ref 80.0–100.0)
MCV: 94.3 fL (ref 80.0–100.0)
MONO ABS: 0.7 10*3/uL (ref 0.1–1.0)
MONO ABS: 0.7 10*3/uL (ref 0.1–1.0)
MONOS PCT: 7 %
Monocytes Relative: 6 %
NEUTROS ABS: 7.1 10*3/uL (ref 1.7–7.7)
NEUTROS ABS: 9.1 10*3/uL — AB (ref 1.7–7.7)
NRBC: 0 % (ref 0.0–0.2)
Neutrophils Relative %: 73 %
Neutrophils Relative %: 81 %
PLATELETS: 260 10*3/uL (ref 150–400)
Platelets: 297 10*3/uL (ref 150–400)
RBC: 4.06 MIL/uL (ref 3.87–5.11)
RBC: 4.21 MIL/uL (ref 3.87–5.11)
RDW: 11.8 % (ref 11.5–15.5)
RDW: 11.9 % (ref 11.5–15.5)
WBC: 9.7 10*3/uL (ref 4.0–10.5)
WBC: 11.3 10*3/uL — AB (ref 4.0–10.5)
nRBC: 0 % (ref 0.0–0.2)

## 2018-10-10 LAB — COMPREHENSIVE METABOLIC PANEL
ALBUMIN: 4.9 g/dL (ref 3.5–5.0)
ALT: 19 U/L (ref 0–44)
ALT: 22 U/L (ref 0–44)
ANION GAP: 8 (ref 5–15)
AST: 17 U/L (ref 15–41)
AST: 21 U/L (ref 15–41)
Albumin: 4.3 g/dL (ref 3.5–5.0)
Alkaline Phosphatase: 58 U/L (ref 38–126)
Alkaline Phosphatase: 67 U/L (ref 38–126)
Anion gap: 10 (ref 5–15)
BUN: 8 mg/dL (ref 6–20)
BUN: 7 mg/dL (ref 6–20)
CHLORIDE: 107 mmol/L (ref 98–111)
CO2: 18 mmol/L — AB (ref 22–32)
CO2: 19 mmol/L — AB (ref 22–32)
CREATININE: 0.49 mg/dL (ref 0.44–1.00)
Calcium: 8.8 mg/dL — ABNORMAL LOW (ref 8.9–10.3)
Calcium: 9.3 mg/dL (ref 8.9–10.3)
Chloride: 106 mmol/L (ref 98–111)
Creatinine, Ser: 0.58 mg/dL (ref 0.44–1.00)
GFR calc non Af Amer: >60 mL/min (ref >60)
GLUCOSE: 99 mg/dL (ref 70–99)
Glucose, Bld: 92 mg/dL (ref 70–99)
Potassium: 2.9 mmol/L — ABNORMAL LOW (ref 3.5–5.1)
Potassium: 3.1 mmol/L — ABNORMAL LOW (ref 3.5–5.1)
SODIUM: 133 mmol/L — AB (ref 135–145)
SODIUM: 135 mmol/L (ref 135–145)
Total Bilirubin: 0.8 mg/dL (ref 0.3–1.2)
Total Bilirubin: 1 mg/dL (ref 0.3–1.2)
Total Protein: 7.2 g/dL (ref 6.5–8.1)
Total Protein: 8 g/dL (ref 6.5–8.1)

## 2018-10-10 LAB — URINALYSIS, ROUTINE W REFLEX MICROSCOPIC
BILIRUBIN URINE: NEGATIVE
Glucose, UA: NEGATIVE mg/dL
HGB URINE DIPSTICK: NEGATIVE
Ketones, ur: 40 mg/dL — AB
Leukocytes,Ua: NEGATIVE
NITRITE: NEGATIVE
PROTEIN: NEGATIVE mg/dL
SPECIFIC GRAVITY, URINE: 1.01 (ref 1.005–1.030)
pH: 7 (ref 5.0–8.0)

## 2018-10-10 LAB — PREGNANCY, URINE: Preg Test, Ur: POSITIVE — AB

## 2018-10-10 LAB — LIPASE, BLOOD
LIPASE: 41 U/L (ref 11–51)
Lipase: 41 U/L (ref 11–51)

## 2018-10-10 MED ORDER — POTASSIUM CHLORIDE CRYS ER 20 MEQ PO TBCR: 20.0000 meq | EXTENDED_RELEASE_TABLET | Freq: Two times a day (BID) | ORAL | 0 refills | Status: DC

## 2018-10-10 MED ORDER — POLYETHYLENE GLYCOL 3350 17 G PO PACK: 17.0000 g | PACK | Freq: Every day | ORAL | Status: DC | PRN

## 2018-10-10 MED ORDER — PROMETHAZINE HCL 12.5 MG PO TABS: 12.5000 mg | ORAL_TABLET | Freq: Four times a day (QID) | ORAL | 0 refills | Status: DC | PRN

## 2018-10-10 MED ORDER — CALCIUM CARBONATE ANTACID 500 MG PO CHEW: 1.0000 | CHEWABLE_TABLET | Freq: Three times a day (TID) | ORAL | Status: DC | PRN

## 2018-10-10 MED ORDER — OXYCODONE HCL 5 MG PO TABS
5.0000 mg | ORAL_TABLET | ORAL | Status: DC | PRN
  Administered 2018-10-10 – 2018-10-11 (×2): 5 mg via ORAL
  Filled 2018-10-10 (×3): qty 1

## 2018-10-10 MED ORDER — ACETAMINOPHEN 325 MG PO TABS: 650.0000 mg | ORAL_TABLET | Freq: Four times a day (QID) | ORAL | Status: DC | PRN

## 2018-10-10 MED ORDER — ONDANSETRON 4 MG PO TBDP: 4.0000 mg | ORAL_TABLET | Freq: Four times a day (QID) | ORAL | Status: DC | PRN

## 2018-10-10 MED ORDER — ENOXAPARIN SODIUM 40 MG/0.4ML ~~LOC~~ SOLN
40.0000 mg | SUBCUTANEOUS | Status: DC
  Administered 2018-10-10: 40 mg via SUBCUTANEOUS
  Filled 2018-10-10: qty 0.4

## 2018-10-10 MED ORDER — BUPROPION HCL ER (XL) 300 MG PO TB24
300.0000 mg | ORAL_TABLET | Freq: Every day | ORAL | Status: DC
  Administered 2018-10-12: 300 mg via ORAL
  Filled 2018-10-10: qty 1

## 2018-10-10 MED ORDER — DOCUSATE SODIUM 100 MG PO CAPS
100.0000 mg | ORAL_CAPSULE | Freq: Two times a day (BID) | ORAL | Status: DC
  Administered 2018-10-10 – 2018-10-12 (×3): 100 mg via ORAL
  Filled 2018-10-10 (×3): qty 1

## 2018-10-10 MED ORDER — ONDANSETRON HCL 4 MG/2ML IJ SOLN
4.0000 mg | Freq: Four times a day (QID) | INTRAMUSCULAR | Status: DC | PRN
  Administered 2018-10-10: 4 mg via INTRAVENOUS
  Filled 2018-10-10: qty 2

## 2018-10-10 MED ORDER — POTASSIUM CHLORIDE IN NACL 20-0.9 MEQ/L-% IV SOLN
INTRAVENOUS | Status: DC
  Administered 2018-10-10 – 2018-10-12 (×3): via INTRAVENOUS
  Filled 2018-10-10 (×4): qty 1000

## 2018-10-10 MED ORDER — DIPHENHYDRAMINE HCL 50 MG/ML IJ SOLN: 25.0000 mg | Freq: Four times a day (QID) | INTRAMUSCULAR | Status: DC | PRN

## 2018-10-10 MED ORDER — POTASSIUM CHLORIDE CRYS ER 20 MEQ PO TBCR
80.0000 meq | EXTENDED_RELEASE_TABLET | Freq: Once | ORAL | Status: AC
  Administered 2018-10-10: 80 meq via ORAL
  Filled 2018-10-10: qty 4

## 2018-10-10 MED ORDER — BUSPIRONE HCL 10 MG PO TABS
10.0000 mg | ORAL_TABLET | Freq: Two times a day (BID) | ORAL | Status: DC
  Administered 2018-10-10 – 2018-10-12 (×3): 10 mg via ORAL
  Filled 2018-10-10 (×3): qty 1

## 2018-10-10 MED ORDER — SUCRALFATE 1 GM/10ML PO SUSP
1.0000 g | Freq: Three times a day (TID) | ORAL | Status: DC
  Administered 2018-10-10: 1 g via ORAL
  Filled 2018-10-10: qty 10

## 2018-10-10 MED ORDER — FAMOTIDINE 20 MG PO TABS: 20.0000 mg | ORAL_TABLET | Freq: Two times a day (BID) | ORAL | 0 refills | Status: DC

## 2018-10-10 MED ORDER — PROMETHAZINE HCL 25 MG PO TABS
12.5000 mg | ORAL_TABLET | ORAL | Status: AC
  Administered 2018-10-10: 12.5 mg via ORAL
  Filled 2018-10-10: qty 1

## 2018-10-10 MED ORDER — ONDANSETRON HCL 4 MG/2ML IJ SOLN
4.0000 mg | Freq: Once | INTRAMUSCULAR | Status: AC
  Administered 2018-10-10: 4 mg via INTRAVENOUS
  Filled 2018-10-10: qty 2

## 2018-10-10 MED ORDER — SIMETHICONE 80 MG PO CHEW: 40.0000 mg | CHEWABLE_TABLET | Freq: Four times a day (QID) | ORAL | Status: DC | PRN

## 2018-10-10 MED ORDER — PIPERACILLIN-TAZOBACTAM 3.375 G IVPB
3.3750 g | Freq: Three times a day (TID) | INTRAVENOUS | Status: DC
  Administered 2018-10-10 – 2018-10-11 (×3): 3.375 g via INTRAVENOUS
  Filled 2018-10-10 (×2): qty 50

## 2018-10-10 MED ORDER — PIPERACILLIN-TAZOBACTAM 3.375 G IVPB 30 MIN
3.3750 g | Freq: Once | INTRAVENOUS | Status: AC
  Administered 2018-10-10: 3.375 g via INTRAVENOUS
  Filled 2018-10-10 (×2): qty 50

## 2018-10-10 MED ORDER — ACETAMINOPHEN 650 MG RE SUPP: 650.0000 mg | Freq: Four times a day (QID) | RECTAL | Status: DC | PRN

## 2018-10-10 MED ORDER — MORPHINE SULFATE (PF) 4 MG/ML IV SOLN
4.0000 mg | Freq: Once | INTRAVENOUS | Status: AC
  Administered 2018-10-10: 4 mg via INTRAVENOUS
  Filled 2018-10-10: qty 1

## 2018-10-10 MED ORDER — PROMETHAZINE HCL 25 MG/ML IJ SOLN: 12.5000 mg | Freq: Four times a day (QID) | INTRAMUSCULAR | Status: DC | PRN

## 2018-10-10 MED ORDER — SODIUM CHLORIDE 0.9 % IV BOLUS
1000.0000 mL | Freq: Once | INTRAVENOUS | Status: AC
  Administered 2018-10-10: 1000 mL via INTRAVENOUS

## 2018-10-10 MED ORDER — MORPHINE SULFATE (PF) 2 MG/ML IV SOLN
2.0000 mg | INTRAVENOUS | Status: DC | PRN
  Administered 2018-10-10 – 2018-10-12 (×7): 2 mg via INTRAVENOUS
  Filled 2018-10-10 (×8): qty 1

## 2018-10-10 MED ORDER — DIPHENHYDRAMINE HCL 25 MG PO CAPS: 25.0000 mg | ORAL_CAPSULE | Freq: Four times a day (QID) | ORAL | Status: DC | PRN

## 2018-10-10 NOTE — ED Provider Notes (Signed)
  Physical Exam  BP 91/64 (BP Location: Right Arm)   Pulse 80   Temp 98.6 F (37 C) (Oral)   Resp 16   Ht 5\' 3"  (1.6 m)   Wt 51.3 kg   LMP 09/09/2018 (Exact Date)   SpO2 100%   BMI 20.02 kg/m   Physical Exam  ED Course/Procedures     Procedures  MDM  Transferred from med [facility].  Pregnant with likely cholecystitis.  Continue to worsening pain right upper quadrant.  States she had similar episode her last pregnancy.  Will have patient seen by general surgery.       Benjiman Core, MD 10/10/18 1430

## 2018-10-10 NOTE — ED Triage Notes (Signed)
Patient was discharge this morning came back with severe abdominal pain and vomiting.

## 2018-10-10 NOTE — ED Provider Notes (Signed)
[facility] EMERGENCY DEPARTMENT Provider Note   CSN: [id number] Arrival date & time: 10/10/18  1015    History   Chief Complaint Chief Complaint  Patient presents with  . Emesis  . Abdominal Pain    HPI Taylor Luna is a 38 y.o. female.     HPI  38 year old female G2, P88 with LMP September 07, 2018 presents with concern for right upper quadrant abdominal pain for 2 days.  She had been seen in the emergency department last night, had normal lab work, and was discharged with right upper quadrant ultrasound scheduled today.  No lower abdominal pain.  She returns to the emergency department today for her ultrasound.  She reports severe unrelenting pain that has worsened since she had returned home.  Reports the pain is severe.  Has not had anything to eat today except a sip of water just prior to arrival.  She has now developed vomiting and has continued nausea.  Denies fevers.  Reports regular menses, is 4wk 3 days pregnant by LMP.  Has not had [place]. Has no lower abdominal symptoms.   Past Medical History:  Diagnosis Date  . Depression with anxiety   . Dizziness     Patient Active Problem List   Diagnosis Date Noted  . Chronic constipation 06/10/2014    History reviewed. No pertinent surgical history.   OB History    Gravida  1   Para      Term      Preterm      AB      Living        SAB      TAB      Ectopic      Multiple      Live Births               Home Medications    Prior to Admission medications   Medication Sig Start Date End Date Taking? Authorizing Provider  buPROPion (WELLBUTRIN XL) 300 MG 24 hr tablet Take 300 mg by mouth daily.   Yes [provider]  busPIRone (BUSPAR) 10 MG tablet Take 10 mg by mouth 2 (two) times daily.   Yes [provider]  famotidine (PEPCID) 20 MG tablet Take 1 tablet (20 mg total) by mouth 2 (two) times daily. 10/10/18  Yes Palumbo, April, MD  potassium chloride SA  (K-DUR,KLOR-CON) 20 MEQ tablet Take 1 tablet (20 mEq total) by mouth 2 (two) times daily. 10/10/18  Yes Palumbo, April, MD  promethazine (PHENERGAN) 12.5 MG tablet Take 1 tablet (12.5 mg total) by mouth every 6 (six) hours as needed for nausea or vomiting. 10/10/18  Yes Palumbo, April, MD  ciprofloxacin (CIPRO) 500 MG tablet Take 1 tablet (500 mg total) by mouth 2 (two) times daily. 12/04/17   Azalia Bilis, MD  ibuprofen (ADVIL,MOTRIN) 800 MG tablet Take 1 tablet (800 mg total) by mouth 3 (three) times daily. 05/17/14   Palumbo, April, MD  metoCLOPramide (REGLAN) 10 MG tablet Take 1 tablet (10 mg total) by mouth every 6 (six) hours as needed for nausea. 12/04/17   Azalia Bilis, MD  mirtazapine (REMERON) 45 MG tablet Take 45 mg by mouth at bedtime.    [provider]  ondansetron (ZOFRAN ODT) 8 MG disintegrating tablet Take 1 tablet (8 mg total) by mouth every 8 (eight) hours as needed for nausea or vomiting. 12/04/17   Azalia Bilis, MD  ondansetron (ZOFRAN) 4 MG tablet Take 1 tablet (4 mg total)  by mouth every 6 (six) hours. 05/25/14   Rancour, Jeannett SeniorStephen, MD  polyethylene glycol [facility](MIRALAX) packet Take 17 g by mouth daily. 05/25/14   Glynn Octaveancour, Stephen, MD    Family History History reviewed. No pertinent family history.  Social History Social History   Tobacco Use  . Smoking status: Passive Smoke Exposure - Never Smoker  . Smokeless tobacco: Never Used  Substance Use Topics  . Alcohol use: No  . Drug use: No     Allergies   Erythrocin; Erythromycin; and Cefdinir   Review of Systems Review of Systems  Constitutional: Negative for fever.  HENT: Negative for sore throat.   Eyes: Negative for visual disturbance.  Respiratory: Negative for cough and shortness of breath.   Cardiovascular: Negative for chest pain.  Gastrointestinal: Positive for abdominal pain, nausea and vomiting.  Genitourinary: Negative for difficulty urinating.  Musculoskeletal: Negative for back pain and neck  pain.  Skin: Negative for rash.  Neurological: Negative for syncope and headaches.     Physical Exam Updated Vital Signs BP 109/75 (BP Location: Right Arm)   Pulse 80   Temp 97.6 F (36.4 C) (Oral)   Resp 18   Ht 5\' 3"  (1.6 m)   Wt 51.3 kg   LMP 09/09/2018 (Exact Date)   SpO2 100%   BMI 20.02 kg/m   Physical Exam Vitals signs and nursing note reviewed.  Constitutional:      General: She is not in acute distress.    Appearance: She is well-developed. She is not diaphoretic.  HENT:     Head: Normocephalic and atraumatic.  Eyes:     Conjunctiva/sclera: Conjunctivae normal.  Neck:     Musculoskeletal: Normal range of motion.  Cardiovascular:     Rate and Rhythm: Normal rate and regular rhythm.     Heart sounds: Normal heart sounds. No murmur. No friction rub. No gallop.   Pulmonary:     Effort: Pulmonary effort is normal. No respiratory distress.     Breath sounds: Normal breath sounds. No wheezing or rales.  Abdominal:     General: There is no distension.     Palpations: Abdomen is soft.     Tenderness: There is abdominal tenderness in the right upper quadrant. There is no guarding. Positive signs include Murphy's sign.  Musculoskeletal:        General: No tenderness.  Skin:    General: Skin is warm and dry.     Findings: No erythema or rash.  Neurological:     Mental Status: She is alert and oriented to person, place, and time.      ED Treatments / Results  Labs (all labs ordered are listed, but only abnormal results are displayed) Labs Reviewed  CBC WITH DIFFERENTIAL/PLATELET - Abnormal; Notable for the following components:      Result Value   WBC 11.3 (*)    Neutro Abs 9.1 (*)    All other components within normal limits  COMPREHENSIVE METABOLIC PANEL - Abnormal; Notable for the following components:   Potassium 3.1 (*)    CO2 19 (*)    All other components within normal limits  LIPASE, BLOOD    EKG None  Radiology [place] Abdomen Complete  Result  Date: 10/10/2018 CLINICAL DATA:  Abdominal pain with nausea and vomiting EXAM: ABDOMEN ULTRASOUND COMPLETE COMPARISON:  May 25, 2014 FINDINGS: Gallbladder: Within the gallbladder, there are echogenic foci which move and shadow, largest measuring 1.8 cm. Gallbladder wall is mildly thickened and subtly edematous with slight pericholecystic  fluid. No sonographic Murphy sign noted by sonographer. Common bile duct: Diameter: 5 mm. No intrahepatic, common hepatic, or common bile duct dilatation. Liver: No focal lesion identified. Within normal limits in parenchymal echogenicity. Portal vein is patent on color Doppler imaging with normal direction of blood flow towards the liver. IVC: No abnormality visualized. Pancreas: No pancreatic mass or inflammatory focus. Spleen: Size and appearance within normal limits. Right Kidney: Length: 11.0 cm. Echogenicity within normal limits. No mass or hydronephrosis visualized. Left Kidney: Length: 10.4 cm. Echogenicity within normal limits. No mass or hydronephrosis visualized. Abdominal aorta: No aneurysm visualized. Other findings: No demonstrable ascites. There is fluid in bowel adjacent to the inflamed gallbladder. IMPRESSION: Cholelithiasis with mildly thickened gallbladder wall and slight pericholecystic fluid. These are findings concerning for a degree of acute cholecystitis. Study otherwise unremarkable. Electronically Signed   By: Bretta Bang III M.D.   On: 10/10/2018 09:20    Procedures Procedures (including critical care time)  Medications Ordered in ED Medications  piperacillin-tazobactam (ZOSYN) IVPB 3.375 g (3.375 g Intravenous New Bag/Given 10/10/18 1056)  piperacillin-tazobactam (ZOSYN) IVPB 3.375 g (has no administration in time range)  sodium chloride 0.9 % bolus 1,000 mL (1,000 mLs Intravenous New Bag/Given 10/10/18 1032)  morphine 4 MG/ML injection 4 mg (4 mg Intravenous Given 10/10/18 1034)  ondansetron (ZOFRAN) injection 4 mg (4 mg Intravenous  Given 10/10/18 1034)     Initial Impression / Assessment and Plan / ED Course  I have reviewed the triage vital signs and the nursing notes.  Pertinent labs & imaging results that were available during my care of the patient were reviewed by me and considered in my medical decision making (see chart for details).        38 year old female G2, P48 with LMP September 07, 2018 presents with concern for right upper quadrant abdominal pain for 2 days.  She had been seen in the emergency department last night, had normal lab work, and was discharged with right upper quadrant ultrasound scheduled today.  RUQ [place] today shows cholelithiasis with pericholecystic fluid and mild GB wall thickening.  She is clinically worsening since her evaluation last night and I am concerned that clinical findings and [place] are both consistent with acute cholecystitis.  I do not feel ectopic pregnancy is etiology of symptoms given location of pain and US findings.   Consulted General Surgery, and will transfer to [facility] ED for further evaluation.  Given zosyn, morphine and zofran.  Reordered labs.  Accepted for transfer to [facility] for surgical evaluation.   Final Clinical Impressions(s) / ED Diagnoses   Final diagnoses:  Cholecystitis  First trimester pregnancy    ED Discharge Orders    None       Alvira Monday, MD 10/10/18 1105

## 2018-10-10 NOTE — Progress Notes (Signed)
Pharmacy Antibiotic Note  Taylor Luna is a 38 y.o. pregnant female admitted on 10/10/2018 with intra-abdominal infection.  Pharmacy has been consulted for Zosyn dosing. SCr 0.49 on admit. Noted, patient reports "rash" allergy to cefdinir, but per discussion with Dr. Dalene Seltzer, patient reports tolerating PCN/amoxicillin without issues in the past.  Plan: Zosyn 3.375g IV ( inf) x1; then 3.375g IV q8h (4h inf) Monitor clinical progress, c/s, renal function F/u de-escalation plan/LOT  Height: 5\' 3"  (160 cm) Weight: 113 lb (51.3 kg) IBW/kg (Calculated) : 52.4  Temp (24hrs), Avg:97.7 F (36.5 C), Min:97.6 F (36.4 C), Max:98 F (36.7 C)  Recent Labs  Lab 10/10/18 0454  WBC 9.7  CREATININE 0.49    Estimated Creatinine Clearance: 79.5 mL/min (by C-G formula based on SCr of 0.49 mg/dL).    Allergies  Allergen Reactions  . Erythrocin Nausea Only    Stomach ache with nausea and vomiting.  . Erythromycin   . Cefdinir Rash    Babs Bertin, PharmD, BCPS Please check AMION for all [facility] contact numbers Clinical Pharmacist 10/10/2018 10:39 AM

## 2018-10-10 NOTE — ED Triage Notes (Signed)
Pt c/o 7/10 upper abd pain since yesterday with nausea not vomiting, pt denies any vaginal discharge or urinary symptoms.

## 2018-10-10 NOTE — ED Provider Notes (Addendum)
[facility] EMERGENCY DEPARTMENT Provider Note   CSN: [id number] Arrival date & time: 10/10/18  7425    History   Chief Complaint Chief Complaint  Patient presents with  . Abdominal Pain    HPI Taylor Luna is a 38 y.o. female.     The history is provided by the patient.  Abdominal Pain  Pain location:  LUQ and RUQ Pain radiates to:  Does not radiate Pain severity:  Severe Onset quality:  Gradual Duration:  1 day Timing:  Constant Progression:  Unchanged Chronicity:  New Context: not laxative use, not recent travel, not sick contacts, not suspicious food intake and not trauma   Relieved by:  Nothing Worsened by:  Nothing Ineffective treatments: tums. Associated symptoms: nausea   Associated symptoms: no chest pain, no constipation, no diarrhea, no dysuria, no fever, no hematochezia, no shortness of breath, no vaginal bleeding, no vaginal discharge and no vomiting   Associated symptoms comment:  No pelvic pain, no vaginal bleeding nor discharge.   Risk factors: pregnancy   Patient who is G2P1 with LMP of 09/07/2018 presents with BUQ pain since Sunday. Patient has nausea, no emesis.  No diarrhea, no constipation.  No pelvic complaints.  Took tums without relief.    Past Medical History:  Diagnosis Date  . Depression with anxiety   . Dizziness     Patient Active Problem List   Diagnosis Date Noted  . Chronic constipation 06/10/2014    History reviewed. No pertinent surgical history.   OB History   No obstetric history on file.      Home Medications    Prior to Admission medications   Medication Sig Start Date End Date Taking? Authorizing Provider  buPROPion (WELLBUTRIN XL) 300 MG 24 hr tablet Take 300 mg by mouth daily.    [provider]  busPIRone (BUSPAR) 10 MG tablet Take 10 mg by mouth 2 (two) times daily.    [provider]  ciprofloxacin (CIPRO) 500 MG tablet Take 1 tablet (500 mg total) by mouth 2 (two) times daily.  12/04/17   Azalia Bilis, MD  ibuprofen (ADVIL,MOTRIN) 800 MG tablet Take 1 tablet (800 mg total) by mouth 3 (three) times daily. 05/17/14   Saoirse Legere, MD  metoCLOPramide (REGLAN) 10 MG tablet Take 1 tablet (10 mg total) by mouth every 6 (six) hours as needed for nausea. 12/04/17   Azalia Bilis, MD  mirtazapine (REMERON) 45 MG tablet Take 45 mg by mouth at bedtime.    [provider]  ondansetron (ZOFRAN ODT) 8 MG disintegrating tablet Take 1 tablet (8 mg total) by mouth every 8 (eight) hours as needed for nausea or vomiting. 12/04/17   Azalia Bilis, MD  ondansetron (ZOFRAN) 4 MG tablet Take 1 tablet (4 mg total) by mouth every 6 (six) hours. 05/25/14   Rancour, Jeannett Senior, MD  polyethylene glycol [facility]) packet Take 17 g by mouth daily. 05/25/14   [name], MD    Family History No family history on file.  Social History Social History   Tobacco Use  . Smoking status: Passive Smoke Exposure - Never Smoker  . Smokeless tobacco: Never Used  Substance Use Topics  . Alcohol use: No  . Drug use: No     Allergies   Erythrocin; Erythromycin; and Cefdinir   Review of Systems Review of Systems  Constitutional: Negative for fever.  Respiratory: Negative for shortness of breath.   Cardiovascular: Negative for chest pain.  Gastrointestinal: Positive for abdominal pain and  nausea. Negative for constipation, diarrhea, hematochezia and vomiting.  Genitourinary: Negative for decreased urine volume, dysuria, flank pain, frequency, menstrual problem, pelvic pain, vaginal bleeding, vaginal discharge and vaginal pain.  All other systems reviewed and are negative.    Physical Exam Updated Vital Signs BP (!) 122/91   Pulse 77   Temp 97.6 F (36.4 C) (Oral)   Resp 16   Ht 5\' 3"  (1.6 m)   Wt 51.3 kg   LMP 09/05/2018   SpO2 100%   BMI 20.02 kg/m   Physical Exam Vitals signs and nursing note reviewed.  Constitutional:      General: She is not in acute distress.     Appearance: Normal appearance. She is normal weight.  HENT:     Head: Normocephalic and atraumatic.     Nose: Nose normal.     Mouth/Throat:     Mouth: Mucous membranes are moist.     Pharynx: Oropharynx is clear.  Eyes:     Conjunctiva/sclera: Conjunctivae normal.     Pupils: Pupils are equal, round, and reactive to light.  Neck:     Musculoskeletal: Normal range of motion and neck supple.  Cardiovascular:     Rate and Rhythm: Normal rate and regular rhythm.     Pulses: Normal pulses.     Heart sounds: Normal heart sounds.  Pulmonary:     Effort: Pulmonary effort is normal.     Breath sounds: Normal breath sounds.  Abdominal:     General: Abdomen is flat.     Palpations: Abdomen is soft.     Tenderness: There is no abdominal tenderness. There is no guarding or rebound. Negative signs include Murphy's sign, Rovsing's sign, McBurney's sign and psoas sign.     Comments: Hyperactive throughout  Musculoskeletal: Normal range of motion.     Comments: Gait quick and purposeful  Skin:    General: Skin is warm and dry.     Capillary Refill: Capillary refill takes less than 2 seconds.  Neurological:     General: No focal deficit present.     Mental Status: She is alert and oriented to person, place, and time.  Psychiatric:        Mood and Affect: Mood normal.        Behavior: Behavior normal.      ED Treatments / Results  Labs (all labs ordered are listed, but only abnormal results are displayed) Results for orders placed or performed during the hospital encounter of 10/10/18  Urinalysis, Routine w reflex microscopic  Result Value Ref Range   Color, Urine YELLOW YELLOW   APPearance CLEAR CLEAR   Specific Gravity, Urine 1.010 1.005 - 1.030   pH 7.0 5.0 - 8.0   Glucose, UA NEGATIVE NEGATIVE mg/dL   Hgb urine dipstick NEGATIVE NEGATIVE   Bilirubin Urine NEGATIVE NEGATIVE   Ketones, ur 40 (A) NEGATIVE mg/dL   Protein, ur NEGATIVE NEGATIVE mg/dL   Nitrite NEGATIVE NEGATIVE    Leukocytes,Ua NEGATIVE NEGATIVE  Pregnancy, urine  Result Value Ref Range   Preg Test, Ur POSITIVE (A) NEGATIVE  CBC with Differential/Platelet  Result Value Ref Range   WBC 9.7 4.0 - 10.5 K/uL   RBC 4.06 3.87 - 5.11 MIL/uL   Hemoglobin 12.9 12.0 - 15.0 g/dL   HCT 37.6 28.3 - 15.1 %   MCV 93.1 80.0 - 100.0 fL   MCH 31.8 26.0 - 34.0 pg   MCHC 34.1 30.0 - 36.0 g/dL   RDW 76.1 60.7 - 37.1 %  Platelets 260 150 - 400 K/uL   nRBC 0.0 0.0 - 0.2 %   Neutrophils Relative % 73 %   Neutro Abs 7.1 1.7 - 7.7 K/uL   Lymphocytes Relative 19 %   Lymphs Abs 1.8 0.7 - 4.0 K/uL   Monocytes Relative 7 %   Monocytes Absolute 0.7 0.1 - 1.0 K/uL   Eosinophils Relative 1 %   Eosinophils Absolute 0.1 0.0 - 0.5 K/uL   Basophils Relative 0 %   Basophils Absolute 0.0 0.0 - 0.1 K/uL   Immature Granulocytes 0 %   Abs Immature Granulocytes 0.03 0.00 - 0.07 K/uL  Comprehensive metabolic panel  Result Value Ref Range   Sodium 133 (L) 135 - 145 mmol/L   Potassium 2.9 (L) 3.5 - 5.1 mmol/L   Chloride 107 98 - 111 mmol/L   CO2 18 (L) 22 - 32 mmol/L   Glucose, Bld 92 70 - 99 mg/dL   BUN 8 6 - 20 mg/dL   Creatinine, Ser 1.61 0.44 - 1.00 mg/dL   Calcium 8.8 (L) 8.9 - 10.3 mg/dL   Total Protein 7.2 6.5 - 8.1 g/dL   Albumin 4.3 3.5 - 5.0 g/dL   AST 17 15 - 41 U/L   ALT 19 0 - 44 U/L   Alkaline Phosphatase 58 38 - 126 U/L   Total Bilirubin 0.8 0.3 - 1.2 mg/dL   GFR calc non Af Amer >60 >60 mL/min   GFR calc Af Amer >60 >60 mL/min   Anion gap 8 5 - 15  Lipase, blood  Result Value Ref Range   Lipase 41 11 - 51 U/L    Radiology No results found.  Procedures Procedures (including critical care time)  Medications Ordered in ED Medications  sucralfate (CARAFATE) 1 GM/10ML suspension 1 g (1 g Oral Given 10/10/18 0529)  promethazine (PHENERGAN) tablet 12.5 mg (12.5 mg Oral Given 10/10/18 0504)  potassium chloride SA (K-DUR,KLOR-CON) CR tablet 80 mEq (80 mEq Oral Given 10/10/18 0549)  promethazine  (PHENERGAN) tablet 12.5 mg (12.5 mg Oral Given 10/10/18 0616)   Vomited the potassium pill, redosed with phenergan.  Has sent RX for potassium to pharmacy.  Will need to take this in addition to prenatal vitamin given history of hypokalemia  Final Clinical Impressions(s) / ED Diagnoses   Lab work is unremarkable, except for potassium which has been low in the past.  Will schedule outpatient ultrasound, though I doubt biliary colic based on exam and labs.  No GYN complaints at this time but follow up with your OB for ongoing care.  I suspect this is gas and GERD.  Will prescribe pepcid, phazyme and phenergan for nausea.      Return for pain, intractable cough, fevers >100.4 unrelieved by medication, shortness of breath, intractable vomiting, chest pain, shortness of breath, weakness numbness, changes in speech, facial asymmetry,abdominal pain, passing out,Inability to tolerate liquids or food, cough, altered mental status or any concerns. No signs of systemic illness or infection. The patient is nontoxic-appearing on exam and vital signs are within normal limits.   I have reviewed the triage vital signs and the nursing notes. Pertinent labs &imaging results that were available during my care of the patient were reviewed by me and considered in my medical decision making (see chart for details).  After history, exam, and medical workup I feel the patient has been appropriately medically screened and is safe for discharge home. Pertinent diagnoses were discussed with the patient. Patient was given return precautions.  Salli Bodin, MD 10/10/18 2446    Cy Blamer, MD 10/10/18 2863

## 2018-10-10 NOTE — H&P (Signed)
Taylor Luna is an 38 y.o. female.   Chief Complaint: RUQ pain HPI: Pain has occurred since Mon.  It is worsening.  Having nausea and vomiting.  Has had symptoms before and had cholecystitis in her first pregnancy as well that responded to abx.    Past Medical History:  Diagnosis Date  . Depression with anxiety   . Dizziness     History reviewed. No pertinent surgical history.  History reviewed. No pertinent family history. Social History:  reports that she is a non-smoker but has been exposed to tobacco smoke. She has never used smokeless tobacco. She reports that she does not drink alcohol or use drugs.  Allergies:  Allergies  Allergen Reactions  . Erythrocin Nausea Only    Stomach ache with nausea and vomiting.  . Erythromycin   . Cefdinir Rash    (Not in a hospital admission)   Results for orders placed or performed during the hospital encounter of 10/10/18 (from the past 48 hour(s))  CBC with Differential     Status: Abnormal   Collection Time: 10/10/18 10:33 AM  Result Value Ref Range   WBC 11.3 (H) 4.0 - 10.5 K/uL   RBC 4.21 3.87 - 5.11 MIL/uL   Hemoglobin 13.5 12.0 - 15.0 g/dL   HCT 00.4 59.9 - 77.4 %   MCV 94.3 80.0 - 100.0 fL   MCH 32.1 26.0 - 34.0 pg   MCHC 34.0 30.0 - 36.0 g/dL   RDW 14.2 39.5 - 32.0 %   Platelets 297 150 - 400 K/uL   nRBC 0.0 0.0 - 0.2 %   Neutrophils Relative % 81 %   Neutro Abs 9.1 (H) 1.7 - 7.7 K/uL   Lymphocytes Relative 13 %   Lymphs Abs 1.5 0.7 - 4.0 K/uL   Monocytes Relative 6 %   Monocytes Absolute 0.7 0.1 - 1.0 K/uL   Eosinophils Relative 0 %   Eosinophils Absolute 0.0 0.0 - 0.5 K/uL   Basophils Relative 0 %   Basophils Absolute 0.0 0.0 - 0.1 K/uL   Immature Granulocytes 0 %   Abs Immature Granulocytes 0.03 0.00 - 0.07 K/uL    Comment: Performed at [facility], [address] [place], [place] [address]  Comprehensive metabolic panel     Status: Abnormal   Collection Time: 10/10/18 10:33 AM  Result Value  Ref Range   Sodium 135 135 - 145 mmol/L   Potassium 3.1 (L) 3.5 - 5.1 mmol/L   Chloride 106 98 - 111 mmol/L   CO2 19 (L) 22 - 32 mmol/L   Glucose, Bld 99 70 - 99 mg/dL   BUN 7 6 - 20 mg/dL   Creatinine, Ser 5.68 0.44 - 1.00 mg/dL   Calcium 9.3 8.9 - 61.6 mg/dL   Total Protein 8.0 6.5 - 8.1 g/dL   Albumin 4.9 3.5 - 5.0 g/dL   AST 21 15 - 41 U/L   ALT 22 0 - 44 U/L   Alkaline Phosphatase 67 38 - 126 U/L   Total Bilirubin 1.0 0.3 - 1.2 mg/dL   GFR calc non Af Amer >60 >60 mL/min   GFR calc Af Amer >60 >60 mL/min   Anion gap 10 5 - 15    Comment: Performed at [facility], [address] [place], [place] [address]  Lipase, blood     Status: None   Collection Time: 10/10/18 10:33 AM  Result Value Ref Range   Lipase 41 11 - 51  U/L    Comment: Performed at [facility], [address] [place], [place] [address]   US Abdomen Complete  Result Date: 10/10/2018 CLINICAL DATA:  Abdominal pain with nausea and vomiting EXAM: ABDOMEN ULTRASOUND COMPLETE COMPARISON:  May 25, 2014 FINDINGS: Gallbladder: Within the gallbladder, there are echogenic foci which move and shadow, largest measuring 1.8 cm. Gallbladder wall is mildly thickened and subtly edematous with slight pericholecystic fluid. No sonographic Murphy sign noted by sonographer. Common bile duct: Diameter: 5 mm. No intrahepatic, common hepatic, or common bile duct dilatation. Liver: No focal lesion identified. Within normal limits in parenchymal echogenicity. Portal vein is patent on color Doppler imaging with normal direction of blood flow towards the liver. IVC: No abnormality visualized. Pancreas: No pancreatic mass or inflammatory focus. Spleen: Size and appearance within normal limits. Right Kidney: Length: 11.0 cm. Echogenicity within normal limits. No mass or hydronephrosis visualized. Left Kidney: Length: 10.4 cm. Echogenicity within normal limits. No mass or hydronephrosis visualized. Abdominal aorta: No  aneurysm visualized. Other findings: No demonstrable ascites. There is fluid in bowel adjacent to the inflamed gallbladder. IMPRESSION: Cholelithiasis with mildly thickened gallbladder wall and slight pericholecystic fluid. These are findings concerning for a degree of acute cholecystitis. Study otherwise unremarkable. Electronically Signed   By: Bretta Bang III M.D.   On: 10/10/2018 09:20    Review of Systems  Constitutional: Negative for chills and fever.  HENT: Negative for hearing loss.   Eyes: Negative for blurred vision.  Respiratory: Negative for cough and hemoptysis.   Cardiovascular: Negative for chest pain and palpitations.  Gastrointestinal: Positive for abdominal pain and nausea.  Genitourinary: Negative for dysuria and urgency.  Musculoskeletal: Negative for myalgias and neck pain.  Skin: Negative for itching and rash.  Neurological: Negative for dizziness and headaches.    Blood pressure 91/64, pulse 80, temperature 98.6 F (37 C), temperature source Oral, resp. rate 16, height 5\' 3"  (1.6 m), weight 51.3 kg, last menstrual period 09/09/2018, SpO2 100 %. Physical Exam  Constitutional: She appears well-developed and well-nourished.  HENT:  Head: Normocephalic and atraumatic.  Eyes: Pupils are equal, round, and reactive to light. Conjunctivae and EOM are normal.  Neck: Normal range of motion. Neck supple.  Cardiovascular: Normal rate and regular rhythm.  Respiratory: Effort normal. No respiratory distress.  GI: Soft. There is abdominal tenderness (RUQ).  Musculoskeletal: Normal range of motion.  Skin: Skin is warm and dry.    RUQ [place] relatively normal with mild inflammation noted surrounding the gallbladder  Assessment/Plan 38 y.o. F 4.[redacted] wks pregnant with symptomatic cholelithiasis.  We discussed the risk of surgery in the first trimester.  We have decided to admit with IV Abx and NPO.  Repeat labs in AM.  If her pain does not improve, we will discuss the specific  risks of surgery later this week.    Vanita Panda, MD 10/10/2018, 2:53 PM

## 2018-10-10 NOTE — ED Notes (Signed)
ED TO INPATIENT HANDOFF REPORT  Name/Age/Gender Taylor Luna 38 y.o. female  Code Status    Code Status Orders  (From admission, onward)         Start     Ordered   10/10/18 1512  Full code  Continuous     10/10/18 1515        Code Status History    This patient has a current code status but no historical code status.      Home/SNF/Other Home  Chief Complaint RECHECK, VOMITING  Level of Care/Admitting Diagnosis ED Disposition    ED Disposition Condition Comment   Admit  Hospital Area: [facility] [[id number]]  Level of Care: Med-Surg [16]  Diagnosis: Acute cholecystitis [575.0.ICD-9-CM]  Admitting Physician: CCS, MD [[id number]]  Attending Physician: CCS, MD [[id number]]  Bed request comments: 5 west  PT Class (Do Not Modify): Observation [104]  PT Acc Code (Do Not Modify): Observation [10022]       Medical History Past Medical History:  Diagnosis Date  . Depression with anxiety   . Dizziness     Allergies Allergies  Allergen Reactions  . Erythromycin Nausea And Vomiting  . Cefdinir Rash    IV Location/Drains/Wounds Patient Lines/Drains/Airways Status   Active Line/Drains/Airways    Name:   Placement date:   Placement time:   Site:   Days:   Peripheral IV 10/10/18 Left Antecubital   10/10/18    1025    Antecubital   less than 1          Labs/Imaging Results for orders placed or performed during the hospital encounter of 10/10/18 (from the past 48 hour(s))  CBC with Differential     Status: Abnormal   Collection Time: 10/10/18 10:33 AM  Result Value Ref Range   WBC 11.3 (H) 4.0 - 10.5 K/uL   RBC 4.21 3.87 - 5.11 MIL/uL   Hemoglobin 13.5 12.0 - 15.0 g/dL   HCT 16.1 09.6 - 04.5 %   MCV 94.3 80.0 - 100.0 fL   MCH 32.1 26.0 - 34.0 pg   MCHC 34.0 30.0 - 36.0 g/dL   RDW 40.9 81.1 - 91.4 %   Platelets 297 150 - 400 K/uL   nRBC 0.0 0.0 - 0.2 %   Neutrophils Relative % 81 %   Neutro Abs 9.1 (H) 1.7 - 7.7 K/uL   Lymphocytes Relative  13 %   Lymphs Abs 1.5 0.7 - 4.0 K/uL   Monocytes Relative 6 %   Monocytes Absolute 0.7 0.1 - 1.0 K/uL   Eosinophils Relative 0 %   Eosinophils Absolute 0.0 0.0 - 0.5 K/uL   Basophils Relative 0 %   Basophils Absolute 0.0 0.0 - 0.1 K/uL   Immature Granulocytes 0 %   Abs Immature Granulocytes 0.03 0.00 - 0.07 K/uL    Comment: Performed at [facility], [address] [place], [place] [address]  Comprehensive metabolic panel     Status: Abnormal   Collection Time: 10/10/18 10:33 AM  Result Value Ref Range   Sodium 135 135 - 145 mmol/L   Potassium 3.1 (L) 3.5 - 5.1 mmol/L   Chloride 106 98 - 111 mmol/L   CO2 19 (L) 22 - 32 mmol/L   Glucose, Bld 99 70 - 99 mg/dL   BUN 7 6 - 20 mg/dL   Creatinine, Ser 6.21 0.44 - 1.00 mg/dL   Calcium 9.3 8.9 - 30.8 mg/dL   Total Protein 8.0 6.5 - 8.1 g/dL  Albumin 4.9 3.5 - 5.0 g/dL   AST 21 15 - 41 U/L   ALT 22 0 - 44 U/L   Alkaline Phosphatase 67 38 - 126 U/L   Total Bilirubin 1.0 0.3 - 1.2 mg/dL   GFR calc non Af Amer >60 >60 mL/min   GFR calc Af Amer >60 >60 mL/min   Anion gap 10 5 - 15    Comment: Performed at [facility], [address] [place], [place] [address]  Lipase, blood     Status: None   Collection Time: 10/10/18 10:33 AM  Result Value Ref Range   Lipase 41 11 - 51 U/L    Comment: Performed at [facility], [address] [place], [place] [address]   US Abdomen Complete  Result Date: 10/10/2018 CLINICAL DATA:  Abdominal pain with nausea and vomiting EXAM: ABDOMEN ULTRASOUND COMPLETE COMPARISON:  May 25, 2014 FINDINGS: Gallbladder: Within the gallbladder, there are echogenic foci which move and shadow, largest measuring 1.8 cm. Gallbladder wall is mildly thickened and subtly edematous with slight pericholecystic fluid. No sonographic Murphy sign noted by sonographer. Common bile duct: Diameter: 5 mm. No intrahepatic, common hepatic, or common bile duct dilatation. Liver: No focal lesion  identified. Within normal limits in parenchymal echogenicity. Portal vein is patent on color Doppler imaging with normal direction of blood flow towards the liver. IVC: No abnormality visualized. Pancreas: No pancreatic mass or inflammatory focus. Spleen: Size and appearance within normal limits. Right Kidney: Length: 11.0 cm. Echogenicity within normal limits. No mass or hydronephrosis visualized. Left Kidney: Length: 10.4 cm. Echogenicity within normal limits. No mass or hydronephrosis visualized. Abdominal aorta: No aneurysm visualized. Other findings: No demonstrable ascites. There is fluid in bowel adjacent to the inflamed gallbladder. IMPRESSION: Cholelithiasis with mildly thickened gallbladder wall and slight pericholecystic fluid. These are findings concerning for a degree of acute cholecystitis. Study otherwise unremarkable. Electronically Signed   By: Bretta Bang III M.D.   On: 10/10/2018 09:20   None  Pending Labs Unresulted Labs (From admission, onward)    Start     Ordered   10/17/18 0500  Creatinine, serum  (enoxaparin (LOVENOX)    CrCl >/= 30 ml/min)  Weekly,   R    Comments:  while on enoxaparin therapy    10/10/18 1515   10/11/18 0500  Comprehensive metabolic panel  Tomorrow morning,   R     10/10/18 1515   10/11/18 0500  CBC  Tomorrow morning,   R     10/10/18 1515   10/10/18 1512  HIV antibody (Routine Testing)  Once,   R     10/10/18 1515          Vitals/Pain Today's Vitals   10/10/18 1331 10/10/18 1333 10/10/18 1427 10/10/18 1539  BP: 91/64   105/64  Pulse: 80   78  Resp: 16   16  Temp: 98.6 F (37 C)     TempSrc: Oral     SpO2: 100%   100%  Weight:      Height:      PainSc:  2  6      Isolation Precautions No active isolations  Medications Medications  piperacillin-tazobactam (ZOSYN) IVPB 3.375 g (has no administration in time range)  buPROPion (WELLBUTRIN XL) 24 hr tablet 300 mg (has no administration in time range)  busPIRone (BUSPAR) tablet  10 mg (has no administration in time range)  enoxaparin (LOVENOX) injection 40 mg (has no administration in  time range)  0.9 % NaCl with KCl 20 mEq/ L  infusion (has no administration in time range)  acetaminophen (TYLENOL) tablet 650 mg (has no administration in time range)    Or  acetaminophen (TYLENOL) suppository 650 mg (has no administration in time range)  oxyCODONE (Oxy IR/ROXICODONE) immediate release tablet 5 mg (has no administration in time range)  morphine 2 MG/ML injection 2-4 mg (has no administration in time range)  diphenhydrAMINE (BENADRYL) capsule 25 mg (has no administration in time range)    Or  diphenhydrAMINE (BENADRYL) injection 25 mg (has no administration in time range)  docusate sodium (COLACE) capsule 100 mg (has no administration in time range)  polyethylene glycol (MIRALAX / GLYCOLAX) packet 17 g (has no administration in time range)  ondansetron (ZOFRAN-ODT) disintegrating tablet 4 mg (has no administration in time range)    Or  ondansetron (ZOFRAN) injection 4 mg (has no administration in time range)  simethicone (MYLICON) chewable tablet 40 mg (has no administration in time range)  promethazine (PHENERGAN) injection 12.5 mg (has no administration in time range)  calcium carbonate (TUMS - dosed in mg elemental calcium) chewable tablet 200 mg of elemental calcium (has no administration in time range)  sodium chloride 0.9 % bolus 1,000 mL (0 mLs Intravenous Stopped 10/10/18 1158)  morphine 4 MG/ML injection 4 mg (4 mg Intravenous Given 10/10/18 1034)  ondansetron (ZOFRAN) injection 4 mg (4 mg Intravenous Given 10/10/18 1034)  piperacillin-tazobactam (ZOSYN) IVPB 3.375 g (0 g Intravenous Stopped 10/10/18 1158)    Mobility walks

## 2018-10-11 ENCOUNTER — Observation Stay (HOSPITAL_COMMUNITY): Payer: BC Managed Care – PPO | Admitting: Anesthesiology

## 2018-10-11 ENCOUNTER — Encounter (HOSPITAL_COMMUNITY): Payer: Self-pay | Admitting: Certified Registered Nurse Anesthetist

## 2018-10-11 ENCOUNTER — Encounter (HOSPITAL_COMMUNITY)
Admission: EM | Disposition: A | Discharge status: Home / Self Care | Payer: Self-pay | Source: Home / Self Care | Attending: Emergency Medicine

## 2018-10-11 HISTORY — PX: CHOLECYSTECTOMY: SHX55

## 2018-10-11 LAB — COMPREHENSIVE METABOLIC PANEL
ALT: 16 U/L (ref 0–44)
AST: 13 U/L — ABNORMAL LOW (ref 15–41)
Albumin: 3.8 g/dL (ref 3.5–5.0)
Alkaline Phosphatase: 55 U/L (ref 38–126)
Anion gap: 8 (ref 5–15)
BUN: 5 mg/dL — ABNORMAL LOW (ref 6–20)
CO2: 17 mmol/L — ABNORMAL LOW (ref 22–32)
Calcium: 8.3 mg/dL — ABNORMAL LOW (ref 8.9–10.3)
Chloride: 108 mmol/L (ref 98–111)
Creatinine, Ser: 0.53 mg/dL (ref 0.44–1.00)
GFR calc Af Amer: >60 mL/min (ref >60)
GFR calc non Af Amer: >60 mL/min (ref >60)
Glucose, Bld: 76 mg/dL (ref 70–99)
Potassium: 4 mmol/L (ref 3.5–5.1)
Sodium: 133 mmol/L — ABNORMAL LOW (ref 135–145)
Total Bilirubin: 1.3 mg/dL — ABNORMAL HIGH (ref 0.3–1.2)
Total Protein: 6.7 g/dL (ref 6.5–8.1)

## 2018-10-11 LAB — CBC
HCT: 38 % (ref 36.0–46.0)
HCT: 32.1 % — ABNORMAL LOW (ref 36.0–46.0)
Hemoglobin: 12.2 g/dL (ref 12.0–15.0)
Hemoglobin: 10.4 g/dL — ABNORMAL LOW (ref 12.0–15.0)
MCH: 32.4 pg (ref 26.0–34.0)
MCH: 33.1 pg (ref 26.0–34.0)
MCHC: 32.1 g/dL (ref 30.0–36.0)
MCHC: 32.4 g/dL (ref 30.0–36.0)
MCV: 101.1 fL — ABNORMAL HIGH (ref 80.0–100.0)
MCV: 102.2 fL — ABNORMAL HIGH (ref 80.0–100.0)
Platelets: 225 10*3/uL (ref 150–400)
Platelets: 162 10*3/uL (ref 150–400)
RBC: 3.76 MIL/uL — ABNORMAL LOW (ref 3.87–5.11)
RBC: 3.14 MIL/uL — ABNORMAL LOW (ref 3.87–5.11)
RDW: 12 % (ref 11.5–15.5)
RDW: 12.1 % (ref 11.5–15.5)
WBC: 13.2 10*3/uL — ABNORMAL HIGH (ref 4.0–10.5)
WBC: 12.8 10*3/uL — AB (ref 4.0–10.5)
nRBC: 0 % (ref 0.0–0.2)
nRBC: 0 % (ref 0.0–0.2)

## 2018-10-11 LAB — HIV ANTIBODY (ROUTINE TESTING W REFLEX): HIV Screen 4th Generation wRfx: NONREACTIVE

## 2018-10-11 SURGERY — LAPAROSCOPIC CHOLECYSTECTOMY
Anesthesia: General | Site: Abdomen

## 2018-10-11 MED ORDER — PROPOFOL 10 MG/ML IV BOLUS
INTRAVENOUS | Status: DC | PRN
  Administered 2018-10-11: 160 mg via INTRAVENOUS

## 2018-10-11 MED ORDER — 0.9 % SODIUM CHLORIDE (POUR BTL) OPTIME
TOPICAL | Status: DC | PRN
  Administered 2018-10-11: 1000 mL

## 2018-10-11 MED ORDER — HYDROMORPHONE HCL 1 MG/ML IJ SOLN
INTRAMUSCULAR | Status: AC
  Filled 2018-10-11: qty 1

## 2018-10-11 MED ORDER — BUPIVACAINE HCL (PF) 0.25 % IJ SOLN
INTRAMUSCULAR | Status: AC
  Filled 2018-10-11: qty 30

## 2018-10-11 MED ORDER — NEOSTIGMINE METHYLSULFATE 10 MG/10ML IV SOLN
INTRAVENOUS | Status: DC | PRN
  Administered 2018-10-11: 3 mg via INTRAVENOUS

## 2018-10-11 MED ORDER — OXYCODONE HCL 5 MG PO TABS: 5.0000 mg | ORAL_TABLET | Freq: Once | ORAL | Status: DC | PRN

## 2018-10-11 MED ORDER — LIDOCAINE HCL (CARDIAC) PF 100 MG/5ML IV SOSY
PREFILLED_SYRINGE | INTRAVENOUS | Status: DC | PRN
  Administered 2018-10-11: 100 mg via INTRAVENOUS

## 2018-10-11 MED ORDER — FENTANYL CITRATE (PF) 100 MCG/2ML IJ SOLN
INTRAMUSCULAR | Status: DC | PRN
  Administered 2018-10-11 (×2): 50 ug via INTRAVENOUS

## 2018-10-11 MED ORDER — ENOXAPARIN SODIUM 40 MG/0.4ML ~~LOC~~ SOLN
40.0000 mg | SUBCUTANEOUS | Status: DC
  Administered 2018-10-12: 40 mg via SUBCUTANEOUS
  Filled 2018-10-11: qty 0.4

## 2018-10-11 MED ORDER — EPHEDRINE SULFATE 50 MG/ML IJ SOLN
INTRAMUSCULAR | Status: DC | PRN
  Administered 2018-10-11: 5 mg via INTRAVENOUS

## 2018-10-11 MED ORDER — LACTATED RINGERS IR SOLN
Status: DC | PRN
  Administered 2018-10-11: 1000 mL

## 2018-10-11 MED ORDER — ONDANSETRON HCL 4 MG/2ML IJ SOLN
INTRAMUSCULAR | Status: DC | PRN
  Administered 2018-10-11: 4 mg via INTRAVENOUS

## 2018-10-11 MED ORDER — MEPERIDINE HCL 50 MG/ML IJ SOLN: 6.2500 mg | INTRAMUSCULAR | Status: DC | PRN

## 2018-10-11 MED ORDER — LACTATED RINGERS IV SOLN
INTRAVENOUS | Status: DC | PRN
  Administered 2018-10-11 (×2): via INTRAVENOUS

## 2018-10-11 MED ORDER — SODIUM CHLORIDE 0.9 % IV BOLUS
500.0000 mL | Freq: Once | INTRAVENOUS | Status: AC
  Administered 2018-10-11: 500 mL via INTRAVENOUS

## 2018-10-11 MED ORDER — ROCURONIUM BROMIDE 100 MG/10ML IV SOLN
INTRAVENOUS | Status: DC | PRN
  Administered 2018-10-11: 40 mg via INTRAVENOUS

## 2018-10-11 MED ORDER — HYDROMORPHONE HCL 1 MG/ML IJ SOLN
0.2500 mg | INTRAMUSCULAR | Status: DC | PRN
  Administered 2018-10-11 (×4): 0.5 mg via INTRAVENOUS

## 2018-10-11 MED ORDER — FENTANYL CITRATE (PF) 100 MCG/2ML IJ SOLN
INTRAMUSCULAR | Status: AC
  Filled 2018-10-11: qty 2

## 2018-10-11 MED ORDER — BUPIVACAINE HCL 0.25 % IJ SOLN
INTRAMUSCULAR | Status: DC | PRN
  Administered 2018-10-11: 30 mL

## 2018-10-11 MED ORDER — PROPOFOL 10 MG/ML IV BOLUS
INTRAVENOUS | Status: AC
  Filled 2018-10-11: qty 20

## 2018-10-11 MED ORDER — GLYCOPYRROLATE 0.2 MG/ML IJ SOLN
INTRAMUSCULAR | Status: DC | PRN
  Administered 2018-10-11: .4 mg via INTRAVENOUS

## 2018-10-11 MED ORDER — PROMETHAZINE HCL 25 MG/ML IJ SOLN: 6.2500 mg | INTRAMUSCULAR | Status: DC | PRN

## 2018-10-11 MED ORDER — OXYCODONE HCL 5 MG/5ML PO SOLN: 5.0000 mg | Freq: Once | ORAL | Status: DC | PRN

## 2018-10-11 SURGICAL SUPPLY — 36 items
ADH SKN CLS APL DERMABOND .7 (GAUZE/BANDAGES/DRESSINGS) ×1
APPLIER CLIP 5 13 M/L LIGAMAX5 (MISCELLANEOUS) ×3
APR CLP MED LRG 5 ANG JAW (MISCELLANEOUS) ×1
BAG SPEC RTRVL LRG 6X4 10 (ENDOMECHANICALS) ×1
CABLE HIGH FREQUENCY MONO STRZ (ELECTRODE) ×3 IMPLANT
CHLORAPREP W/TINT 26ML (MISCELLANEOUS) ×3 IMPLANT
CLIP APPLIE 5 13 M/L LIGAMAX5 (MISCELLANEOUS) ×1 IMPLANT
COVER MAYO STAND STRL (DRAPES) IMPLANT
COVER WAND RF STERILE (DRAPES) IMPLANT
DERMABOND ADVANCED (GAUZE/BANDAGES/DRESSINGS) ×2
DERMABOND ADVANCED .7 DNX12 (GAUZE/BANDAGES/DRESSINGS) ×1 IMPLANT
DRAPE C-ARM 42X120 X-RAY (DRAPES) IMPLANT
ELECT REM PT RETURN 15FT ADLT (MISCELLANEOUS) ×3 IMPLANT
GLOVE BIO SURGEON STRL SZ 6.5 (GLOVE) ×2 IMPLANT
GLOVE BIO SURGEONS STRL SZ 6.5 (GLOVE) ×1
GLOVE BIOGEL PI IND STRL 7.0 (GLOVE) ×1 IMPLANT
GLOVE BIOGEL PI INDICATOR 7.0 (GLOVE) ×2
GOWN STRL REUS W/TWL 2XL LVL3 (GOWN DISPOSABLE) ×3 IMPLANT
GOWN STRL REUS W/TWL XL LVL3 (GOWN DISPOSABLE) ×6 IMPLANT
IRRIG SUCT STRYKERFLOW 2 WTIP (MISCELLANEOUS) ×3
IRRIGATION SUCT STRKRFLW 2 WTP (MISCELLANEOUS) ×1 IMPLANT
IV CATH 14GX2 1/4 (CATHETERS) IMPLANT
KIT BASIN OR (CUSTOM PROCEDURE TRAY) ×3 IMPLANT
POUCH SPECIMEN RETRIEVAL 10MM (ENDOMECHANICALS) ×3 IMPLANT
SCISSORS LAP 5X35 DISP (ENDOMECHANICALS) ×3 IMPLANT
SET CHOLANGIOGRAPH MIX (MISCELLANEOUS) IMPLANT
SET TUBE SMOKE EVAC HIGH FLOW (TUBING) ×3 IMPLANT
SLEEVE XCEL OPT CAN 5 100 (ENDOMECHANICALS) ×6 IMPLANT
SUT VIC AB 2-0 SH 27 (SUTURE)
SUT VIC AB 2-0 SH 27X BRD (SUTURE) ×1 IMPLANT
SUT VIC AB 4-0 PS2 18 (SUTURE) ×3 IMPLANT
TOWEL OR 17X26 10 PK STRL BLUE (TOWEL DISPOSABLE) ×3 IMPLANT
TOWEL OR NON WOVEN STRL DISP B (DISPOSABLE) ×3 IMPLANT
TRAY LAPAROSCOPIC (CUSTOM PROCEDURE TRAY) ×3 IMPLANT
TROCAR BLADELESS OPT 5 100 (ENDOMECHANICALS) ×3 IMPLANT
TROCAR XCEL BLUNT TIP 100MML (ENDOMECHANICALS) ×3 IMPLANT

## 2018-10-11 NOTE — Transfer of Care (Signed)
Immediate Anesthesia Transfer of Care Note  Patient: Taylor Luna  Procedure(s) Performed: LAPAROSCOPIC CHOLECYSTECTOMY (N/A Abdomen)  Patient Location: PACU  Anesthesia Type:General  Level of Consciousness: awake, oriented, drowsy and patient cooperative  Airway & Oxygen Therapy: Patient Spontanous Breathing and Patient connected to face mask oxygen  Post-op Assessment: Report given to RN, Post -op Vital signs reviewed and stable and Patient moving all extremities  Post vital signs: Reviewed and stable  Last Vitals:  Vitals Value Taken Time  BP 111/79 10/11/2018 11:37 AM  Temp    Pulse 92 10/11/2018 11:41 AM  Resp 20 10/11/2018 11:41 AM  SpO2 98 % 10/11/2018 11:41 AM  Vitals shown include unvalidated device data.  Last Pain:  Vitals:   10/11/18 1000  TempSrc: Oral  PainSc: 6       Patients Stated Pain Goal: 3 (10/11/18 1000)  Complications: No apparent anesthesia complications

## 2018-10-11 NOTE — Progress Notes (Signed)
Notified MD of bp 83/53 awaiting orders. Will continue to monitor patient.

## 2018-10-11 NOTE — Anesthesia Preprocedure Evaluation (Addendum)
Anesthesia Evaluation  Patient identified by MRN, date of birth, ID band Patient awake    Reviewed: Allergy & Precautions, NPO status , Patient's Chart, lab work & pertinent test results  Airway Mallampati: I  TM Distance: >3 FB Neck ROM: Full    Dental  (+) Dental Advisory Given, Teeth Intact   Pulmonary neg pulmonary ROS,    Pulmonary exam normal breath sounds clear to auscultation       Cardiovascular negative cardio ROS Normal cardiovascular exam Rhythm:Regular Rate:Normal     Neuro/Psych negative neurological ROS  negative psych ROS   GI/Hepatic negative GI ROS, Neg liver ROS,   Endo/Other  negative endocrine ROS  Renal/GU negative Renal ROS     Musculoskeletal negative musculoskeletal ROS (+)   Abdominal   Peds  Hematology negative hematology ROS (+)   Anesthesia Other Findings   Reproductive/Obstetrics negative OB ROS                             Anesthesia Physical Anesthesia Plan  ASA: I  Anesthesia Plan: General   Post-op Pain Management:    Induction: Intravenous  PONV Risk Score and Plan: 4 or greater and Ondansetron, Dexamethasone, Propofol infusion and Treatment may vary due to age or medical condition  Airway Management Planned: Oral ETT  Additional Equipment: None  Intra-op Plan:   Post-operative Plan: Extubation in OR  Informed Consent: I have reviewed the patients History and Physical, chart, labs and discussed the procedure including the risks, benefits and alternatives for the proposed anesthesia with the patient or authorized representative who has indicated his/her understanding and acceptance.     Dental advisory given  Plan Discussed with: CRNA  Anesthesia Plan Comments: (Will avoid versed and nsaids. Discussed risk of birth defects and miscarriage. She expresses understanding. )       Anesthesia Quick Evaluation

## 2018-10-11 NOTE — Progress Notes (Signed)
Cholelithiasis  Subjective: Pt with continued pain overnight.    Objective: Vital signs in last 24 hours: Temp:  [97.6 F (36.4 C)-98.6 F (37 C)] 97.7 F (36.5 C) (02/26 0538) Pulse Rate:  [69-99] 94 (02/26 0538) Resp:  [16-18] 16 (02/26 0538) BP: (91-121)/(63-76) 98/63 (02/26 0538) SpO2:  [95 %-100 %] 95 % (02/26 0538) Weight:  [51.3 kg] 51.3 kg (02/25 1022)    Intake/Output from previous day: 02/25 0701 - 02/26 0700 In: 2123.9 [P.O.:260; I.V.:745.1; IV Piggyback:1118.8] Out: 900 [Urine:900] Intake/Output this shift: Total I/O In: 282.6 [I.V.:282.6] Out: -   General appearance: alert and cooperative GI: normal findings: soft, tender in RUQ  Lab Results:  Results for orders placed or performed during the hospital encounter of 10/10/18 (from the past 24 hour(s))  CBC with Differential     Status: Abnormal   Collection Time: 10/10/18 10:33 AM  Result Value Ref Range   WBC 11.3 (H) 4.0 - 10.5 K/uL   RBC 4.21 3.87 - 5.11 MIL/uL   Hemoglobin 13.5 12.0 - 15.0 g/dL   HCT 95.2 84.1 - 32.4 %   MCV 94.3 80.0 - 100.0 fL   MCH 32.1 26.0 - 34.0 pg   MCHC 34.0 30.0 - 36.0 g/dL   RDW 40.1 02.7 - 25.3 %   Platelets 297 150 - 400 K/uL   nRBC 0.0 0.0 - 0.2 %   Neutrophils Relative % 81 %   Neutro Abs 9.1 (H) 1.7 - 7.7 K/uL   Lymphocytes Relative 13 %   Lymphs Abs 1.5 0.7 - 4.0 K/uL   Monocytes Relative 6 %   Monocytes Absolute 0.7 0.1 - 1.0 K/uL   Eosinophils Relative 0 %   Eosinophils Absolute 0.0 0.0 - 0.5 K/uL   Basophils Relative 0 %   Basophils Absolute 0.0 0.0 - 0.1 K/uL   Immature Granulocytes 0 %   Abs Immature Granulocytes 0.03 0.00 - 0.07 K/uL  Comprehensive metabolic panel     Status: Abnormal   Collection Time: 10/10/18 10:33 AM  Result Value Ref Range   Sodium 135 135 - 145 mmol/L   Potassium 3.1 (L) 3.5 - 5.1 mmol/L   Chloride 106 98 - 111 mmol/L   CO2 19 (L) 22 - 32 mmol/L   Glucose, Bld 99 70 - 99 mg/dL   BUN 7 6 - 20 mg/dL   Creatinine, Ser 6.64 0.44  - 1.00 mg/dL   Calcium 9.3 8.9 - 40.3 mg/dL   Total Protein 8.0 6.5 - 8.1 g/dL   Albumin 4.9 3.5 - 5.0 g/dL   AST 21 15 - 41 U/L   ALT 22 0 - 44 U/L   Alkaline Phosphatase 67 38 - 126 U/L   Total Bilirubin 1.0 0.3 - 1.2 mg/dL   GFR calc non Af Amer >60 >60 mL/min   GFR calc Af Amer >60 >60 mL/min   Anion gap 10 5 - 15  Lipase, blood     Status: None   Collection Time: 10/10/18 10:33 AM  Result Value Ref Range   Lipase 41 11 - 51 U/L  Comprehensive metabolic panel     Status: Abnormal   Collection Time: 10/11/18  5:13 AM  Result Value Ref Range   Sodium 133 (L) 135 - 145 mmol/L   Potassium 4.0 3.5 - 5.1 mmol/L   Chloride 108 98 - 111 mmol/L   CO2 17 (L) 22 - 32 mmol/L   Glucose, Bld 76 70 - 99 mg/dL   BUN 5 (L) 6 -  20 mg/dL   Creatinine, Ser 8.11 0.44 - 1.00 mg/dL   Calcium 8.3 (L) 8.9 - 10.3 mg/dL   Total Protein 6.7 6.5 - 8.1 g/dL   Albumin 3.8 3.5 - 5.0 g/dL   AST 13 (L) 15 - 41 U/L   ALT 16 0 - 44 U/L   Alkaline Phosphatase 55 38 - 126 U/L   Total Bilirubin 1.3 (H) 0.3 - 1.2 mg/dL   GFR calc non Af Amer >60 >60 mL/min   GFR calc Af Amer >60 >60 mL/min   Anion gap 8 5 - 15  CBC     Status: Abnormal   Collection Time: 10/11/18  5:13 AM  Result Value Ref Range   WBC 13.2 (H) 4.0 - 10.5 K/uL   RBC 3.76 (L) 3.87 - 5.11 MIL/uL   Hemoglobin 12.2 12.0 - 15.0 g/dL   HCT 91.4 78.2 - 95.6 %   MCV 101.1 (H) 80.0 - 100.0 fL   MCH 32.4 26.0 - 34.0 pg   MCHC 32.1 30.0 - 36.0 g/dL   RDW 21.3 08.6 - 57.8 %   Platelets 225 150 - 400 K/uL   nRBC 0.0 0.0 - 0.2 %     Studies/Results Radiology     MEDS, Scheduled . buPROPion  300 mg Oral Daily  . busPIRone  10 mg Oral BID  . docusate sodium  100 mg Oral BID  . enoxaparin (LOVENOX) injection  40 mg Subcutaneous Q24H     Assessment: Cholecystitis not responding to medical management  Wbc increased despite abx, T bili up to 1.3  Plan: OR today for lap chole.  We have discussed this in detail including the risks of  birth defects and spontaneous abortion with surgery in the first trimester.  The anatomy & physiology of hepatobiliary & pancreatic function was discussed as well.  The pathophysiology of gallbladder dysfunction was discussed.  Natural history risks without surgery was discussed.   I feel the risks of no intervention will lead to serious problems that outweigh the operative risks; therefore, I recommended cholecystectomy to remove the pathology.  I explained laparoscopic techniques with possible need for an open approach.  Possible cholangiogram to evaluate the bilary tract was explained as well although we will try to avoid the radiation if possible.    Risks such as bleeding, infection, abscess, leak, injury to other organs, need for further treatment, heart attack, death, and other risks were discussed.  I noted a good likelihood this will help address the problem.  Possibility that this will not correct all abdominal symptoms was explained.  Goals of post-operative recovery were discussed as well.  We will work to minimize complications.  An educational handout further explaining the pathology and treatment options was given as well.  Questions were answered.  The patient expresses understanding & wishes to proceed with surgery.   LOS: 0 days    Taylor Panda, MD [facility], [place] [phone]   10/11/2018 9:10 AM

## 2018-10-11 NOTE — Anesthesia Postprocedure Evaluation (Deleted)
Anesthesia Post Note  Patient: Taylor Luna  Procedure(s) Performed: LAPAROSCOPIC CHOLECYSTECTOMY (N/A Abdomen)     Patient location during evaluation: PACU Anesthesia Type: General Level of consciousness: sedated and patient cooperative Pain management: pain level controlled Vital Signs Assessment: post-procedure vital signs reviewed and stable Respiratory status: spontaneous breathing Cardiovascular status: stable Anesthetic complications: no    Last Vitals:  Vitals:   10/11/18 1145 10/11/18 1200  BP: 101/66 98/60  Pulse: 86 74  Resp: 18 15  Temp:  36.9 C  SpO2: 100% 99%    Last Pain:  Vitals:   10/11/18 1200  TempSrc:   PainSc: 4                  Lewie Loron

## 2018-10-11 NOTE — Op Note (Addendum)
10/10/2018 - 10/11/2018  11:22 AM  PATIENT:  Taylor Luna  38 y.o. female  Patient Care Team: Sigmund Hazel, MD as PCP - General (Family Medicine)  PRE-OPERATIVE DIAGNOSIS:  Cholecystitis   POST-OPERATIVE DIAGNOSIS:  cholecystitis  PROCEDURE:  LAPAROSCOPIC CHOLECYSTECTOMY    Surgeon(s): Romie Levee, MD  ASSISTANT: Leary Roca PA   ANESTHESIA:   local and general  EBL: 75ml  Total I/O In: 1282.6 [I.V.:1282.6] Out: 200 [Urine:200]  DRAINS: none   SPECIMEN:  Source of Specimen:  Gallbladder  DISPOSITION OF SPECIMEN:  PATHOLOGY  COUNTS:  YES  PLAN OF CARE: Pt admitted  PATIENT DISPOSITION:  PACU - hemodynamically stable.  INDICATION:   The anatomy & physiology of hepatobiliary & pancreatic function was discussed.  The pathophysiology of gallbladder dysfunction was discussed.  Natural history risks without surgery was discussed.   I feel the risks of no intervention will lead to serious problems that outweigh the operative risks; therefore, I recommended cholecystectomy to remove the pathology.  I explained laparoscopic techniques with possible need for an open approach.  Probable cholangiogram to evaluate the bilary tract was explained as well.    Risks such as bleeding, infection, abscess, leak, injury to other organs, need for further treatment, heart attack, death, and other risks were discussed.  I noted a good likelihood this will help address the problem.  Possibility that this will not correct all abdominal symptoms was explained.  Goals of post-operative recovery were discussed as well.    OR FINDINGS: cholecystitis  DESCRIPTION:   The patient was identified & brought into the operating room. The patient was positioned supine with arms tucked. SCDs were active during the entire case. The patient underwent general anesthesia without any difficulty.  The abdomen was prepped and draped in a sterile fashion. A Surgical Timeout was performed and confirmed our  plan.  We positioned the patient in reverse [place] & right side up.  I placed a Hassan laparoscopic port through the umbilicus using open entry technique.  Entry was clean. There were no adhesions to the anterior abdominal wall supraumbilically.  We induced carbon dioxide insufflation. Camera inspection revealed no injury.    I proceeded to continue with laparoscopic technique. I placed a 5 mm port in mid subcostal region, another 7mm port in the right flank near the anterior axillary line, and a 58mm port in the left subxiphoid region obliquely within the falciform ligament.  I turned attention to the right upper quadrant.    The gallbladder fundus was elevated cephalad. I used cautery and blunt dissection to free the peritoneal coverings between the gallbladder and the liver on the posteriolateral and anteriomedial walls.   I used careful blunt and cautery dissection with a maryland dissector to help get a good critical view of the cystic artery and cystic duct. I did further dissection to free a few centimeters of the  gallbladder off the liver bed to get a good critical view of the infundibulum and cystic duct. I mobilized the cystic artery.  I skeletonized the cystic duct.  After getting a good 360 view, I decided to not to perform a cholangiogram.  I placed a clip on the infundibulum.  I placed clips on the cystic duct x3.  I completed cystic duct transection.   I placed clips on the cystic artery x3 with 2 proximally.  I ligated the cystic artery using scissors. I freed the gallbladder from its remaining attachments to the liver. I ensured hemostasis on the gallbladder fossa  of the liver and elsewhere. I inspected the rest of the abdomen & detected no injury nor bleeding elsewhere.  I irrigated the RUQ with normal saline.  I removed the gallbladder through the umbilical port site.  I closed the umbilical fascia using 0 Vicryl stitches.   I closed the skin using 4-0 vicryl stitch.  Sterile  dressings were applied. The patient was extubated & arrived in the PACU in stable condition.  I had discussed postoperative care with the patient in the holding area.   I will discuss  operative findings and postoperative goals / instructions with the patient's family.  Instructions are written in the chart as well.

## 2018-10-11 NOTE — Anesthesia Procedure Notes (Signed)
Procedure Name: Intubation Date/Time: 10/11/2018 10:21 AM Performed by: Nolon Nations, MD Pre-anesthesia Checklist: Patient identified, Emergency Drugs available, Suction available and Patient being monitored Patient Re-evaluated:Patient Re-evaluated prior to induction Oxygen Delivery Method: Circle system utilized Preoxygenation: Pre-oxygenation with 100% oxygen Induction Type: IV induction Ventilation: Mask ventilation without difficulty Laryngoscope Size: Mac and 4 Grade View: Grade II Tube type: Oral Tube size: 7.0 mm Number of attempts: 1 Airway Equipment and Method: Stylet and Oral airway Placement Confirmation: ETT inserted through vocal cords under direct vision,  positive ETCO2 and breath sounds checked- equal and bilateral Secured at: 22 cm Tube secured with: Tape Dental Injury: Teeth and Oropharynx as per pre-operative assessment  Comments: Anterior. Cords visualized with head lift/cricoid press

## 2018-10-12 ENCOUNTER — Encounter (HOSPITAL_COMMUNITY): Payer: Self-pay | Admitting: General Surgery

## 2018-10-12 LAB — CBC
HCT: 33.4 % — ABNORMAL LOW (ref 36.0–46.0)
HEMOGLOBIN: 10.5 g/dL — AB (ref 12.0–15.0)
MCH: 32.5 pg (ref 26.0–34.0)
MCHC: 31.4 g/dL (ref 30.0–36.0)
MCV: 103.4 fL — ABNORMAL HIGH (ref 80.0–100.0)
Platelets: 171 10*3/uL (ref 150–400)
RBC: 3.23 MIL/uL — AB (ref 3.87–5.11)
RDW: 12.3 % (ref 11.5–15.5)
WBC: 10.1 10*3/uL (ref 4.0–10.5)
nRBC: 0 % (ref 0.0–0.2)

## 2018-10-12 LAB — BILIRUBIN, TOTAL: Total Bilirubin: 1 mg/dL (ref 0.3–1.2)

## 2018-10-12 MED ORDER — OXYCODONE HCL 5 MG PO TABS
5.0000 mg | ORAL_TABLET | ORAL | Status: DC | PRN
  Administered 2018-10-12: 10 mg via ORAL
  Filled 2018-10-12: qty 2

## 2018-10-12 MED ORDER — PRENATAL MULTIVITAMIN CH
1.0000 | ORAL_TABLET | Freq: Every day | ORAL | Status: DC
  Administered 2018-10-12: 1 via ORAL
  Filled 2018-10-12: qty 1

## 2018-10-12 MED ORDER — OXYCODONE HCL 5 MG PO TABS: 5.0000 mg | ORAL_TABLET | Freq: Four times a day (QID) | ORAL | 0 refills | Status: DC | PRN

## 2018-10-12 MED ORDER — DOCUSATE SODIUM 100 MG PO CAPS: 100.0000 mg | ORAL_CAPSULE | Freq: Two times a day (BID) | ORAL | 0 refills | Status: DC | PRN

## 2018-10-12 MED ORDER — POLYETHYLENE GLYCOL 3350 17 G PO PACK: 17.0000 g | PACK | Freq: Every day | ORAL | 0 refills | Status: DC | PRN

## 2018-10-12 MED ORDER — ACETAMINOPHEN 325 MG PO TABS: 650.0000 mg | ORAL_TABLET | Freq: Four times a day (QID) | ORAL | 0 refills | Status: DC | PRN

## 2018-10-12 NOTE — Progress Notes (Signed)
Initial Nutrition Assessment   INTERVENTION:   Provided brief nutrition education  NUTRITION DIAGNOSIS:   Inadequate oral intake related to nausea, vomiting as evidenced by per patient/family report.  GOAL:   Patient will meet greater than or equal to 90% of their needs  MONITOR:   PO intake, Labs, Weight trends, I & O's  REASON FOR ASSESSMENT:   Malnutrition Screening Tool   ASSESSMENT:   38 year old female G2, P65 with LMP September 07, 2018 presents with concern for right upper quadrant abdominal pain for 2 days.  2/26: s/p LAPAROSCOPIC CHOLECYSTECTOMY  Patient in room with family at bedside. Pt reports she is 4.[redacted] weeks pregnant. States she had the flu recently and lost 5 lb.  Pt states she started having abdominal pain on 2/24. Pt states she did not have morning sickness during her previous pregnancy. States she did eat 1/2 of her eggs and toast this morning. Reviewed foods with pt that may upset her stomach.  Pre-pregnancy weight is 113-115 lb. Pt weight is currently stable.  Pt expected to discharge today.  Medications: Prenatal MVI daily Labs reviewed: Low Na   NUTRITION - FOCUSED PHYSICAL EXAM:  Nutrition focused physical exam shows no sign of depletion of muscle mass or body fat.  Diet Order:   Diet Order            Diet regular Room service appropriate? Yes; Fluid consistency: Thin  Diet effective now              EDUCATION NEEDS:   Education needs have been addressed  Skin:  Skin Assessment: Reviewed RN Assessment  Last BM:  2/24  Height:   Ht Readings from Last 1 Encounters:  10/11/18 5\' 3"  (1.6 m)    Weight:   Wt Readings from Last 1 Encounters:  10/11/18 51.3 kg    Ideal Body Weight:  52.3 kg  Pre-Pregnancy weight: 113-115 lb (51.3-52.2 kg)  BMI:  Body mass index is 20.02 kg/m.  Estimated Nutritional Needs:   Kcal:  1300-1500  Protein:  60-70g  Fluid:  1.5L/day  Tilda Franco, MS, RD, LDN [facility] Clinical  Dietitian Pager: [phone] After Hours Pager: [phone]

## 2018-10-12 NOTE — Discharge Instructions (Signed)
[facility]  Please arrive at least 30 min before your appointment to complete your check in paperwork.  If you are unable to arrive 30 min prior to your appointment time we may have to cancel or reschedule you. LAPAROSCOPIC SURGERY: POST OP INSTRUCTIONS Always review your discharge instruction sheet given to you by the facility where your surgery was performed. IF YOU HAVE DISABILITY OR FAMILY LEAVE FORMS, YOU MUST BRING THEM TO THE OFFICE FOR PROCESSING.   DO NOT GIVE THEM TO YOUR DOCTOR.  PAIN CONTROL  1. First take acetaminophen (Tylenol) to control your pain after surgery.  Follow directions on package.  Taking acetaminophen (Tylenol) after surgery will help to control your pain and lower the amount of prescription pain medication you may need.  You should not take more than 4,000 mg (4 grams) of acetaminophen (Tylenol) in 24 hours.  You should not take ibuprofen (Advil), aleve, motrin, naprosyn or other NSAIDS as you are pregnant 2. A prescription for pain medication may be given to you upon discharge.  Take your pain medication as prescribed, if you still have uncontrolled pain after taking acetaminophen (Tylenol). Please do not drive or operate heavy machinery while taking narcotic pain medication.  3. Use ice packs to help control pain. 4. If you need a refill on your pain medication, please contact your pharmacy.  They will contact our office to request authorization. Prescriptions will not be filled after 5pm or on week-ends. 5. Please make sure to take a prenatal vitamin with folic acid.  6. Please make sure to contact your OB's office and let them know you were in the hospital and underwent a Laparoscopic Cholecystectomy.   HOME MEDICATIONS 7. Take your usually prescribed medications unless otherwise directed.  DIET 8. You should follow a light diet the first few days after arrival home.  Be sure to include lots of fluids daily. Avoid fatty, fried foods.    CONSTIPATION 9. It is common to experience some constipation after surgery and if you are taking pain medication.  Increasing fluid intake and taking a stool softener (such as Colace) will usually help or prevent this problem from occurring.  A mild laxative (Miralax) should be taken according to package instructions if there are no bowel movements after 48 hours.  WOUND/INCISION CARE 10. Most patients will experience some swelling and bruising in the area of the incisions.  Ice packs will help.  Swelling and bruising can take several days to resolve.  11. Unless discharge instructions indicate otherwise, follow guidelines below  a. STERI-STRIPS - you may remove your outer bandages 48 hours after surgery, and you may shower at that time.  You have steri-strips (small skin tapes) in place directly over the incision.  These strips should be left on the skin for 7-10 days.   b. DERMABOND/SKIN GLUE - you may shower in 24 hours.  The glue will flake off over the next 2-3 weeks. 12. Any sutures or staples will be removed at the office during your follow-up visit.  ACTIVITIES 13. You may resume regular (light) daily activities beginning the next day--such as daily self-care, walking, climbing stairs--gradually increasing activities as tolerated.  You may have sexual intercourse when it is comfortable.  Refrain from any heavy lifting or straining until approved by your doctor. Please do not lift >25lbs for 2 weeks. Please do not lift >40 lbs for 4 weeks. After this you can slowly return to your normal activity.  a. You may drive  when you are no longer taking prescription pain medication, you can comfortably wear a seatbelt, and you can safely maneuver your car and apply brakes.  FOLLOW-UP 14. You should see your doctor in the office for a follow-up appointment approximately 2-3 weeks after your surgery.  You should have been given your post-op/follow-up appointment when your surgery was scheduled.  If you  did not receive a post-op/follow-up appointment, make sure that you call for this appointment within a day or two after you arrive home to insure a convenient appointment time.   WHEN TO CALL YOUR DOCTOR: 1. Fever over 101.0 2. Inability to urinate 3. Continued bleeding from incision. 4. Increased pain, redness, or drainage from the incision. 5. Increasing abdominal pain 6. If you have any lower abdominal pain, vaginal bleeding or other concerning symptoms, please call your OB's office or go to the emergency room.   The clinic staff is available to answer your questions during regular business hours.  Please dont hesitate to call and ask to speak to one of the nurses for clinical concerns.  If you have a medical emergency, go to the nearest emergency room or call 911.  A surgeon from [facility] is always on call at the hospital. [address], [address], [place], [place]  [address] ? [address] [address], [place], [place]   [address] [phone] ? [phone] ? FAX [phone]

## 2018-10-12 NOTE — Discharge Summary (Signed)
[facility] Discharge Summary   Patient ID: Taylor Luna MRN: [id number] DOB/AGE: 04-21-1981 37 y.o.  Admit date: 10/10/2018 Discharge date: 10/12/2018  Admitting Diagnosis: Symptomatic cholelithiasis  Discharge Diagnosis Patient Active Problem List   Diagnosis Date Noted  . Acute cholecystitis 10/10/2018  . Chronic constipation 06/10/2014    Consultants None   Procedures Dr. Romie Levee (10/11/2018) - Laparoscopic Cholecystectomy  Hospital Course:  Taylor Luna is a 37 y.o. female who presented to [facility] ED with RUQ abdominal pain with N/V.  Workup showed cholelithiasis with mildly thickened gallbladder wall and slight pericholecystic fluid. Patient is 4.[redacted] weeks pregnant with her second child. The decision was made to admit the patient and start on IV abx, pain control and repeat labs in AM. If patient's pain improved and labs were okay, would plan to have patient follow up as outpatient and perform Laparoscopic Cholecystectomy during 2nd Trimester. On 2/26 patient had continued pain and  WBC increased despite IV abx. The decision was made to undergo Laparoscopic Cholecystectomy. The patient understood risks and benefits of surgery and agreed to proceed.  The patient tolerated procedure well and was transferred to the floor.  Diet was advanced as tolerated.  On POD 1, the patient was voiding well, tolerating diet, ambulating well, pain well controlled with oral medications, vital signs stable, incisions c/d/i and felt stable for discharge home. Patient was instructed to not take NSAIDs at discharge. She was started on prenatal vitamin while in the hospital and is to continue as outpatient. She is currently follow by Dr. Olivia Mackie of OB who she will call for follow up after leaving the hospital. Patient will follow up as below and knows to call with questions or concerns.   I have personally reviewed the patients medication history on the [place] controlled substance  database.   Physical Exam: General:  Alert, NAD, pleasant, comfortable Heart: RRR Pulm: effort normal, CTA b/l Abd:  Soft, ND, appropriately tender, multiple lap incisions C/D/I. +BS Msk: No edema  Allergies as of 10/12/2018      Reactions   Erythromycin Nausea And Vomiting   Cefdinir Rash      Medication List    STOP taking these medications   ciprofloxacin 500 MG tablet Commonly known as:  CIPRO   ibuprofen 800 MG tablet Commonly known as:  ADVIL,MOTRIN   metoCLOPramide 10 MG tablet Commonly known as:  REGLAN   ondansetron 4 MG tablet Commonly known as:  ZOFRAN   ondansetron 8 MG disintegrating tablet Commonly known as:  ZOFRAN ODT     TAKE these medications   acetaminophen 325 MG tablet Commonly known as:  TYLENOL Take 2 tablets (650 mg total) by mouth every 6 (six) hours as needed for mild pain (or temp > 100).   buPROPion 300 MG 24 hr tablet Commonly known as:  WELLBUTRIN XL Take 300 mg by mouth daily.   busPIRone 10 MG tablet Commonly known as:  BUSPAR Take 10 mg by mouth 2 (two) times daily.   docusate sodium 100 MG capsule Commonly known as:  COLACE Take 1 capsule (100 mg total) by mouth 2 (two) times daily as needed for mild constipation.   DYMISTA 137-50 MCG/ACT Susp Generic drug:  Azelastine-Fluticasone Place 1 spray into both nostrils 2 (two) times daily.   famotidine 20 MG tablet Commonly known as:  PEPCID Take 1 tablet (20 mg total) by mouth 2 (two) times daily.   ONE-A-DAY WOMENS PRENATAL 1 28-0.8-235 MG Caps Take 1 capsule  by mouth daily.   oxyCODONE 5 MG immediate release tablet Commonly known as:  Oxy IR/ROXICODONE Take 1 tablet (5 mg total) by mouth every 6 (six) hours as needed for severe pain or breakthrough pain.   polyethylene glycol packet Commonly known as:  MIRALAX / GLYCOLAX Take 17 g by mouth daily as needed for mild constipation. What changed:    when to take this  reasons to take this   potassium chloride SA 20  MEQ tablet Commonly known as:  K-DUR,KLOR-CON Take 1 tablet (20 mEq total) by mouth 2 (two) times daily.   promethazine 12.5 MG tablet Commonly known as:  PHENERGAN Take 1 tablet (12.5 mg total) by mouth every 6 (six) hours as needed for nausea or vomiting.        Follow-up Information    [facility] Follow up on 10/31/2018.   Specialty:  General Surgery Why:  You are scheduled to follow up with the PA on 3/17 at 830am. Please arrive 30 minutes prior to your appointment time for paperwork. Please bring a copy of your photo ID and insurance card.  Contact information: [address] [place] [address] [phone]        Olivia Mackie, MD. Call today.   Specialty:  Obstetrics and Gynecology Why:  Please call your OB's office to inform them that you underwent a Laparoscopic Cholecysectomy while you were in the hospital and see if they would like to see you in their office sooner.  Contact information: [address] [place] [address] [phone]           Signed: Jacinto Halim, [facility] 10/12/2018, 9:28 AM Pager: [phone]

## 2018-10-13 ENCOUNTER — Emergency Department (HOSPITAL_COMMUNITY): Payer: BC Managed Care – PPO

## 2018-10-13 ENCOUNTER — Encounter (HOSPITAL_COMMUNITY): Payer: Self-pay

## 2018-10-13 ENCOUNTER — Observation Stay (HOSPITAL_COMMUNITY)
Admission: EM | Admit: 2018-10-13 | Discharge: 2018-10-14 | Disposition: A | Discharge status: Home / Self Care | Payer: BC Managed Care – PPO | Attending: General Surgery | Admitting: General Surgery

## 2018-10-13 ENCOUNTER — Other Ambulatory Visit: Payer: Self-pay

## 2018-10-13 DIAGNOSIS — G8918 Other acute postprocedural pain: Secondary | ICD-10-CM

## 2018-10-13 DIAGNOSIS — K567 Ileus, unspecified: Secondary | ICD-10-CM | POA: Diagnosis not present

## 2018-10-13 DIAGNOSIS — K5909 Other constipation: Secondary | ICD-10-CM | POA: Insufficient documentation

## 2018-10-13 DIAGNOSIS — Z9049 Acquired absence of other specified parts of digestive tract: Secondary | ICD-10-CM

## 2018-10-13 DIAGNOSIS — O2691 Pregnancy related conditions, unspecified, first trimester: Principal | ICD-10-CM | POA: Insufficient documentation

## 2018-10-13 DIAGNOSIS — R1013 Epigastric pain: Secondary | ICD-10-CM | POA: Diagnosis present

## 2018-10-13 DIAGNOSIS — Z3A01 Less than 8 weeks gestation of pregnancy: Secondary | ICD-10-CM | POA: Insufficient documentation

## 2018-10-13 LAB — CBC WITH DIFFERENTIAL/PLATELET
Abs Immature Granulocytes: 0.04 10*3/uL (ref 0.00–0.07)
Basophils Absolute: 0 10*3/uL (ref 0.0–0.1)
Basophils Relative: 0 %
Eosinophils Absolute: 0 10*3/uL (ref 0.0–0.5)
Eosinophils Relative: 0 %
HCT: 33.3 % — ABNORMAL LOW (ref 36.0–46.0)
Hemoglobin: 11.1 g/dL — ABNORMAL LOW (ref 12.0–15.0)
Immature Granulocytes: 0 %
Lymphocytes Relative: 8 %
Lymphs Abs: 0.7 10*3/uL (ref 0.7–4.0)
MCH: 32.6 pg (ref 26.0–34.0)
MCHC: 33.3 g/dL (ref 30.0–36.0)
MCV: 97.7 fL (ref 80.0–100.0)
Monocytes Absolute: 0.7 10*3/uL (ref 0.1–1.0)
Monocytes Relative: 7 %
Neutro Abs: 7.7 10*3/uL (ref 1.7–7.7)
Neutrophils Relative %: 85 %
Platelets: 200 10*3/uL (ref 150–400)
RBC: 3.41 MIL/uL — ABNORMAL LOW (ref 3.87–5.11)
RDW: 12.3 % (ref 11.5–15.5)
WBC: 9.2 10*3/uL (ref 4.0–10.5)
nRBC: 0 % (ref 0.0–0.2)

## 2018-10-13 LAB — COMPREHENSIVE METABOLIC PANEL
ALT: 39 U/L (ref 0–44)
AST: 36 U/L (ref 15–41)
Albumin: 3.5 g/dL (ref 3.5–5.0)
Alkaline Phosphatase: 60 U/L (ref 38–126)
Anion gap: 8 (ref 5–15)
BUN: 5 mg/dL — ABNORMAL LOW (ref 6–20)
CO2: 19 mmol/L — ABNORMAL LOW (ref 22–32)
Calcium: 8.7 mg/dL — ABNORMAL LOW (ref 8.9–10.3)
Chloride: 106 mmol/L (ref 98–111)
Creatinine, Ser: 0.45 mg/dL (ref 0.44–1.00)
GFR calc Af Amer: >60 mL/min (ref >60)
GFR calc non Af Amer: >60 mL/min (ref >60)
Glucose, Bld: 111 mg/dL — ABNORMAL HIGH (ref 70–99)
Potassium: 3.1 mmol/L — ABNORMAL LOW (ref 3.5–5.1)
Sodium: 133 mmol/L — ABNORMAL LOW (ref 135–145)
Total Bilirubin: 0.8 mg/dL (ref 0.3–1.2)
Total Protein: 6.7 g/dL (ref 6.5–8.1)

## 2018-10-13 LAB — URINALYSIS, ROUTINE W REFLEX MICROSCOPIC
Bilirubin Urine: NEGATIVE
Glucose, UA: NEGATIVE mg/dL
Hgb urine dipstick: NEGATIVE
Ketones, ur: 80 mg/dL — AB
Leukocytes,Ua: NEGATIVE
Nitrite: NEGATIVE
Protein, ur: NEGATIVE mg/dL
Specific Gravity, Urine: 1.013 (ref 1.005–1.030)
pH: 6 (ref 5.0–8.0)

## 2018-10-13 LAB — LIPASE, BLOOD: Lipase: 66 U/L — ABNORMAL HIGH (ref 11–51)

## 2018-10-13 LAB — INFLUENZA PANEL BY PCR (TYPE A & B)
Influenza A By PCR: NEGATIVE
Influenza B By PCR: NEGATIVE

## 2018-10-13 MED ORDER — BUSPIRONE HCL 10 MG PO TABS
10.0000 mg | ORAL_TABLET | Freq: Two times a day (BID) | ORAL | Status: DC
  Administered 2018-10-13 – 2018-10-14 (×3): 10 mg via ORAL
  Filled 2018-10-13 (×3): qty 1

## 2018-10-13 MED ORDER — PRENATAL PLUS 27-1 MG PO TABS
1.0000 | ORAL_TABLET | Freq: Every day | ORAL | Status: DC
  Administered 2018-10-13 – 2018-10-14 (×2): 1 via ORAL
  Filled 2018-10-13 (×3): qty 1

## 2018-10-13 MED ORDER — ACETAMINOPHEN 325 MG PO TABS: 650.0000 mg | ORAL_TABLET | Freq: Four times a day (QID) | ORAL | Status: DC | PRN

## 2018-10-13 MED ORDER — ALUM & MAG HYDROXIDE-SIMETH 200-200-20 MG/5ML PO SUSP
15.0000 mL | Freq: Once | ORAL | Status: AC
  Administered 2018-10-13: 15 mL via ORAL
  Filled 2018-10-13: qty 30

## 2018-10-13 MED ORDER — SODIUM CHLORIDE 0.9 % IV BOLUS
1000.0000 mL | Freq: Once | INTRAVENOUS | Status: AC
  Administered 2018-10-13: 1000 mL via INTRAVENOUS

## 2018-10-13 MED ORDER — SIMETHICONE 80 MG PO CHEW: 40.0000 mg | CHEWABLE_TABLET | Freq: Four times a day (QID) | ORAL | Status: DC | PRN

## 2018-10-13 MED ORDER — DIPHENHYDRAMINE HCL 50 MG/ML IJ SOLN: 25.0000 mg | Freq: Four times a day (QID) | INTRAMUSCULAR | Status: DC | PRN

## 2018-10-13 MED ORDER — DOCUSATE SODIUM 100 MG PO CAPS
100.0000 mg | ORAL_CAPSULE | Freq: Two times a day (BID) | ORAL | Status: DC
  Administered 2018-10-13 – 2018-10-14 (×2): 100 mg via ORAL
  Filled 2018-10-13 (×2): qty 1

## 2018-10-13 MED ORDER — ALUM & MAG HYDROXIDE-SIMETH 200-200-20 MG/5ML PO SUSP: 15.0000 mL | Freq: Four times a day (QID) | ORAL | Status: DC | PRN

## 2018-10-13 MED ORDER — MORPHINE SULFATE (PF) 2 MG/ML IV SOLN: 1.0000 mg | INTRAVENOUS | Status: DC | PRN

## 2018-10-13 MED ORDER — ONDANSETRON HCL 4 MG/2ML IJ SOLN
4.0000 mg | Freq: Four times a day (QID) | INTRAMUSCULAR | Status: DC | PRN
  Administered 2018-10-13 – 2018-10-14 (×2): 4 mg via INTRAVENOUS
  Filled 2018-10-13 (×2): qty 2

## 2018-10-13 MED ORDER — ONDANSETRON 4 MG PO TBDP: 4.0000 mg | ORAL_TABLET | Freq: Four times a day (QID) | ORAL | Status: DC | PRN

## 2018-10-13 MED ORDER — POLYETHYLENE GLYCOL 3350 17 G PO PACK: 17.0000 g | PACK | Freq: Every day | ORAL | Status: DC | PRN

## 2018-10-13 MED ORDER — PANTOPRAZOLE SODIUM 40 MG IV SOLR
40.0000 mg | INTRAVENOUS | Status: DC
  Administered 2018-10-14: 40 mg via INTRAVENOUS
  Filled 2018-10-13: qty 40

## 2018-10-13 MED ORDER — PANTOPRAZOLE SODIUM 40 MG IV SOLR
40.0000 mg | Freq: Once | INTRAVENOUS | Status: AC
  Administered 2018-10-13: 40 mg via INTRAVENOUS
  Filled 2018-10-13: qty 40

## 2018-10-13 MED ORDER — DIPHENHYDRAMINE HCL 25 MG PO CAPS: 25.0000 mg | ORAL_CAPSULE | Freq: Four times a day (QID) | ORAL | Status: DC | PRN

## 2018-10-13 MED ORDER — OXYCODONE HCL 5 MG PO TABS
5.0000 mg | ORAL_TABLET | ORAL | Status: DC | PRN
  Administered 2018-10-13 – 2018-10-14 (×5): 10 mg via ORAL
  Filled 2018-10-13 (×5): qty 2

## 2018-10-13 MED ORDER — BUPROPION HCL ER (XL) 150 MG PO TB24
300.0000 mg | ORAL_TABLET | Freq: Every day | ORAL | Status: DC
  Administered 2018-10-13 – 2018-10-14 (×2): 300 mg via ORAL
  Filled 2018-10-13 (×2): qty 2

## 2018-10-13 MED ORDER — METOPROLOL TARTRATE 5 MG/5ML IV SOLN: 5.0000 mg | Freq: Four times a day (QID) | INTRAVENOUS | Status: DC | PRN

## 2018-10-13 MED ORDER — POTASSIUM CHLORIDE IN NACL 20-0.9 MEQ/L-% IV SOLN
INTRAVENOUS | Status: DC
  Administered 2018-10-13 (×2): via INTRAVENOUS
  Filled 2018-10-13 (×3): qty 1000

## 2018-10-13 MED ORDER — ACETAMINOPHEN 650 MG RE SUPP: 650.0000 mg | Freq: Four times a day (QID) | RECTAL | Status: DC | PRN

## 2018-10-13 MED ORDER — MORPHINE SULFATE (PF) 2 MG/ML IV SOLN
1.0000 mg | INTRAVENOUS | Status: DC | PRN
  Administered 2018-10-13: 2 mg via INTRAVENOUS
  Filled 2018-10-13 (×2): qty 1

## 2018-10-13 MED ORDER — SIMETHICONE 80 MG PO CHEW
80.0000 mg | CHEWABLE_TABLET | Freq: Once | ORAL | Status: AC
  Administered 2018-10-13: 80 mg via ORAL
  Filled 2018-10-13: qty 1

## 2018-10-13 MED ORDER — ENOXAPARIN SODIUM 40 MG/0.4ML ~~LOC~~ SOLN
40.0000 mg | SUBCUTANEOUS | Status: DC
  Administered 2018-10-13 – 2018-10-14 (×2): 40 mg via SUBCUTANEOUS
  Filled 2018-10-13 (×2): qty 0.4

## 2018-10-13 NOTE — Anesthesia Postprocedure Evaluation (Signed)
Anesthesia Post Note  Patient: Taylor Luna  Procedure(s) Performed: LAPAROSCOPIC CHOLECYSTECTOMY (N/A Abdomen)     Patient location during evaluation: PACU Anesthesia Type: General Level of consciousness: sedated and patient cooperative Pain management: pain level controlled Vital Signs Assessment: post-procedure vital signs reviewed and stable Respiratory status: spontaneous breathing Cardiovascular status: stable Anesthetic complications: no    Last Vitals:  Vitals:   10/11/18 2154 10/12/18 0501  BP: 94/63 98/65  Pulse: 89 91  Resp: 17   Temp: 37 C 36.9 C  SpO2: 100% 100%    Last Pain:  Vitals:   10/12/18 0802  TempSrc:   PainSc: 8                  Lewie Loron

## 2018-10-13 NOTE — Progress Notes (Signed)
Noted Patient has some redness and swelling at left lower flank around incision site.

## 2018-10-13 NOTE — Progress Notes (Signed)
[facility] Progress Note     Subjective: CC-  Taylor Luna is a 38yo female about [redacted] weeks pregnant who is 2 days s/p laparoscopic cholecystectomy for acute cholecystitis. She was discharged home from the hospital yesterday in good condition. States that she was doing well until about 1 AM today when she wake up with a severe burning epigastric pain. Pain radiates into her chest. Constant and severe. Feels like reflux but states she has never had problems with reflux before. She also reports fever up to 103. Tylenol helped bring temp down. Denies lower abdominal pain. Some nausea, no emesis. No BM since surgery. Passed some flatus but is burping more. Denies dysuria, vaginal discharge, cough, SOB, CP, or upper respiratory issues. In the ED she was given IV protonix with no benefit.  Labwork fairly normal. WBC 9.2, K 3.1, Cr 0.45, LFTs WNL, lipase 66. Influenza panel negative. U/a pending. CXR negative.   Objective: Vital signs in last 24 hours: Temp:  [98.1 F (36.7 C)-98.3 F (36.8 C)] 98.1 F (36.7 C) (02/28 0930) Pulse Rate:  [86-108] 86 (02/28 0930) Resp:  [16-18] 18 (02/28 0930) BP: (103-111)/(66-78) 103/66 (02/28 0930) SpO2:  [100 %] 100 % (02/28 0930) Weight:  [51.3 kg] 51.3 kg (02/28 0755)    Intake/Output from previous day: No intake/output data recorded. Intake/Output this shift: No intake/output data recorded.  PE: Gen:  Alert, NAD, pleasant HEENT: EOM's intact, pupils equal and round Card:  RRR Pulm:  CTAB, no W/R/R, effort normal Abd: Soft, ND, +BS, lap incisions cdi without erythema or drainage, epigastric and right sided abdominal TTP, no peritonitis Psych: A&Ox3  Skin: no rashes noted, warm and dry  Lab Results:  Recent Labs    10/12/18 0427 10/13/18 0816  WBC 10.1 9.2  HGB 10.5* 11.1*  HCT 33.4* 33.3*  PLT 171 200   BMET Recent Labs    10/11/18 0513 10/13/18 0816  NA 133* 133*  K 4.0 3.1*  CL 108 106  CO2 17* 19*  GLUCOSE 76  111*  BUN 5* 5*  CREATININE 0.53 0.45  CALCIUM 8.3* 8.7*   PT/INR No results for input(s): LABPROT, INR in the last 72 hours. CMP     Component Value Date/Time   NA 133 (L) 10/13/2018 0816   K 3.1 (L) 10/13/2018 0816   CL 106 10/13/2018 0816   CO2 19 (L) 10/13/2018 0816   GLUCOSE 111 (H) 10/13/2018 0816   BUN 5 (L) 10/13/2018 0816   CREATININE 0.45 10/13/2018 0816   CALCIUM 8.7 (L) 10/13/2018 0816   PROT 6.7 10/13/2018 0816   ALBUMIN 3.5 10/13/2018 0816   AST 36 10/13/2018 0816   ALT 39 10/13/2018 0816   ALKPHOS 60 10/13/2018 0816   BILITOT 0.8 10/13/2018 0816   GFRNONAA >60 10/13/2018 0816   GFRAA >60 10/13/2018 0816   Lipase     Component Value Date/Time   LIPASE 66 (H) 10/13/2018 0816       Studies/Results: Dg Chest 2 View  Result Date: 10/13/2018 CLINICAL DATA:  Fever EXAM: CHEST - 2 VIEW COMPARISON:  September 25 2018 FINDINGS: There is no appreciable edema or consolidation. The heart size and pulmonary vascularity are normal. No adenopathy. There is thoracolumbar levoscoliosis. There are surgical clips in the upper abdomen the right. IMPRESSION: No edema or consolidation.  No adenopathy evident. Electronically Signed   By: Bretta Bang III M.D.   On: 10/13/2018 09:31    Anti-infectives: Anti-infectives (From admission, onward)   None  Assessment/Plan About [redacted] weeks pregnant  Cholecystitis s/p lap chole 2/26 Dr. Maisie Fus Postop epigastric pain, nausea, fever - POD#2 - Workup thus far fairy reassuring with normal LFTs and WBC. Lipase slightly elevated at 66. CXR and influenza panel negative. U/a pending but she is having no urinary symptoms. Will obtain RUQ u/s to evaluation for possible fluid collection. Give maalox/simethicone. Will continue to monitor symptoms in ED for now.    LOS: 0 days    Franne Forts , [facility] 10/13/2018, 9:58 AM Pager: [phone] Mon-Thurs 7:00 am-4:30 pm Fri 7:00 am -11:30 AM Sat-Sun 7:00  am-11:30 am

## 2018-10-13 NOTE — ED Notes (Signed)
ED TO INPATIENT HANDOFF REPORT  Name/Age/Gender Taylor Luna State 38 y.o. female  Code Status    Code Status Orders  (From admission, onward)         Start     Ordered   10/13/18 1121  Full code  Continuous     10/13/18 1123        Code Status History    Date Active Date Inactive Code Status Order ID Comments User Context   10/10/2018 1515 10/12/2018 2011 Full Code [id number]  [facility]    Advance Directive Documentation     Most Recent Value  Type of Advance Directive  Living will  Pre-existing out of facility DNR order (yellow form or pink MOST form)  -  "MOST" Form in Place?  -      Home/SNF/Other Home  Chief Complaint reflux,fever,post surt  Level of Care/Admitting Diagnosis ED Disposition    ED Disposition Condition Comment   Admit  [facility] [[id number]]  Level of Care: Med-Surg [16]  Diagnosis: Status post laparoscopic cholecystectomy [408144]  Admitting Physician: CCS, MD [[id number]]  Attending Physician: CCS, MD [[id number]]  Bed request comments: 5 west  PT Class (Do Not Modify): Observation [104]  PT Acc Code (Do Not Modify): Observation [10022]       Medical History Past Medical History:  Diagnosis Date  . Cholecystitis 09/2018  . Depression with anxiety   . Dizziness   . Pregnancy 2008   second pregnancy Jan 2020    Allergies Allergies  Allergen Reactions  . Erythromycin Nausea And Vomiting  . Cefdinir Rash    IV Location/Drains/Wounds Patient Lines/Drains/Airways Status   Active Line/Drains/Airways    Name:   Placement date:   Placement time:   Site:   Days:   Peripheral IV 10/13/18 Right Antecubital   10/13/18    0847    Antecubital   less than 1   Incision (Closed) 10/11/18 Abdomen   10/11/18    1125     2   Incision - 4 Ports Abdomen Right;Lateral Right;Medial;Lateral Umbilicus Mid;Upper   10/11/18    1034     2          Labs/Imaging Results for orders placed or performed during the  hospital encounter of 10/13/18 (from the past 48 hour(s))  CBC with Differential     Status: Abnormal   Collection Time: 10/13/18  8:16 AM  Result Value Ref Range   WBC 9.2 4.0 - 10.5 K/uL   RBC 3.41 (L) 3.87 - 5.11 MIL/uL   Hemoglobin 11.1 (L) 12.0 - 15.0 g/dL   HCT 81.8 (L) 56.3 - 14.9 %   MCV 97.7 80.0 - 100.0 fL   MCH 32.6 26.0 - 34.0 pg   MCHC 33.3 30.0 - 36.0 g/dL   RDW 70.2 63.7 - 85.8 %   Platelets 200 150 - 400 K/uL   nRBC 0.0 0.0 - 0.2 %   Neutrophils Relative % 85 %   Neutro Abs 7.7 1.7 - 7.7 K/uL   Lymphocytes Relative 8 %   Lymphs Abs 0.7 0.7 - 4.0 K/uL   Monocytes Relative 7 %   Monocytes Absolute 0.7 0.1 - 1.0 K/uL   Eosinophils Relative 0 %   Eosinophils Absolute 0.0 0.0 - 0.5 K/uL   Basophils Relative 0 %   Basophils Absolute 0.0 0.0 - 0.1 K/uL   Immature Granulocytes 0 %   Abs Immature Granulocytes 0.04 0.00 - 0.07 K/uL  Comment: Performed at [facility], [address], [place], [place] [address]  Comprehensive metabolic panel     Status: Abnormal   Collection Time: 10/13/18  8:16 AM  Result Value Ref Range   Sodium 133 (L) 135 - 145 mmol/L   Potassium 3.1 (L) 3.5 - 5.1 mmol/L   Chloride 106 98 - 111 mmol/L   CO2 19 (L) 22 - 32 mmol/L   Glucose, Bld 111 (H) 70 - 99 mg/dL   BUN 5 (L) 6 - 20 mg/dL   Creatinine, Ser 4.40 0.44 - 1.00 mg/dL   Calcium 8.7 (L) 8.9 - 10.3 mg/dL   Total Protein 6.7 6.5 - 8.1 g/dL   Albumin 3.5 3.5 - 5.0 g/dL   AST 36 15 - 41 U/L   ALT 39 0 - 44 U/L   Alkaline Phosphatase 60 38 - 126 U/L   Total Bilirubin 0.8 0.3 - 1.2 mg/dL   GFR calc non Af Amer >60 >60 mL/min   GFR calc Af Amer >60 >60 mL/min   Anion gap 8 5 - 15    Comment: Performed at [facility], [address] [place], [place] [address]  Lipase, blood     Status: Abnormal   Collection Time: 10/13/18  8:16 AM  Result Value Ref Range   Lipase 66 (H) 11 - 51 U/L    Comment: Performed at [facility], [address] [place], [place] [address]  Influenza panel by PCR (type A & B)     Status: None   Collection Time: 10/13/18  8:38 AM  Result Value Ref Range   Influenza A By PCR NEGATIVE NEGATIVE   Influenza B By PCR NEGATIVE NEGATIVE    Comment: (NOTE) The Xpert Xpress Flu assay is intended as an aid in the diagnosis of  influenza and should not be used as a sole basis for treatment.  This  assay is FDA approved for nasopharyngeal swab specimens only. Nasal  washings and aspirates are unacceptable for Xpert Xpress Flu testing. Performed at [facility], [address] [place], [place] [address]   Urinalysis, Routine w reflex microscopic     Status: Abnormal   Collection Time: 10/13/18  9:49 AM  Result Value Ref Range   Color, Urine YELLOW YELLOW   APPearance CLEAR CLEAR   Specific Gravity, Urine 1.013 1.005 - 1.030   pH 6.0 5.0 - 8.0   Glucose, UA NEGATIVE NEGATIVE mg/dL   Hgb urine dipstick NEGATIVE NEGATIVE   Bilirubin Urine NEGATIVE NEGATIVE   Ketones, ur 80 (A) NEGATIVE mg/dL   Protein, ur NEGATIVE NEGATIVE mg/dL   Nitrite NEGATIVE NEGATIVE   Leukocytes,Ua NEGATIVE NEGATIVE    Comment: Performed at [facility], [address] [place], [place] [address]   Dg Chest 2 View  Result Date: 10/13/2018 CLINICAL DATA:  Fever EXAM: CHEST - 2 VIEW COMPARISON:  September 25 2018 FINDINGS: There is no appreciable edema or consolidation. The heart size and pulmonary vascularity are normal. No adenopathy. There is thoracolumbar levoscoliosis. There are surgical clips in the upper abdomen the right. IMPRESSION: No edema or consolidation.  No adenopathy evident. Electronically Signed   By: Bretta Bang III M.D.   On: 10/13/2018 09:31   US Abdomen Limited Ruq  Result Date: 10/13/2018 CLINICAL DATA:  Fever with epigastric pain.  Recent cholecystectomy EXAM: ULTRASOUND ABDOMEN LIMITED RIGHT UPPER QUADRANT COMPARISON:  October 10, 2018 FINDINGS:  Gallbladder: The gallbladder is absent.  There is echogenic material within the gallbladder fossa which persists throughout the study and does not show demonstrable peristalsis. Common bile duct: Diameter: 4 mm. No intrahepatic or extrahepatic biliary duct dilatation. Liver: No focal lesion identified. Within normal limits in parenchymal echogenicity. Portal vein is patent on color Doppler imaging with normal direction of blood flow towards the liver. IMPRESSION: Gallbladder absent. There is echogenic material within the gallbladder fossa which does not show convincing peristalsis. Question potential hematoma in the gallbladder fossa region. Aperistalsing bowel in this area is also possible. No obvious air in this structure to indicate abscess by ultrasound. Given clinical symptoms and fever in a recent postoperative patient, CT of the gallbladder fossa region may be indicated to further evaluate this region. Study otherwise unremarkable. Electronically Signed   By: Bretta BangWilliam  Woodruff III M.D.   On: 10/13/2018 10:59   None  Pending Labs Unresulted Labs (From admission, onward)    Start     Ordered   10/20/18 0500  Creatinine, serum  (enoxaparin (LOVENOX)    CrCl >/= 30 ml/min)  Weekly,   R    Comments:  while on enoxaparin therapy    10/13/18 1123   10/14/18 0500  CBC  Tomorrow morning,   R     10/13/18 1123   10/14/18 0500  Comprehensive metabolic panel  Tomorrow morning,   R     10/13/18 1123          Vitals/Pain Today's Vitals   10/13/18 0830 10/13/18 0930 10/13/18 1100 10/13/18 1130  BP: 111/78 103/66 112/77 101/73  Pulse: 99 86 100 93  Resp:  18    Temp:  98.1 F (36.7 C)    TempSrc:  Oral    SpO2: 100% 100% 100% 100%  Weight:      Height:      PainSc:        Isolation Precautions No active isolations  Medications Medications  buPROPion (WELLBUTRIN XL) 24 hr tablet 300 mg (has no administration in time range)  busPIRone (BUSPAR) tablet 10 mg (has no administration in time  range)  ONE-A-DAY WOMENS PRENATAL 1 28-0.8-235 MG CAPS 1 capsule (has no administration in time range)  enoxaparin (LOVENOX) injection 40 mg (has no administration in time range)  0.9 % NaCl with KCl 20 mEq/ L  infusion (has no administration in time range)  acetaminophen (TYLENOL) tablet 650 mg (has no administration in time range)    Or  acetaminophen (TYLENOL) suppository 650 mg (has no administration in time range)  oxyCODONE (Oxy IR/ROXICODONE) immediate release tablet 5-10 mg (has no administration in time range)  morphine 2 MG/ML injection 1-2 mg (has no administration in time range)  diphenhydrAMINE (BENADRYL) capsule 25 mg (has no administration in time range)    Or  diphenhydrAMINE (BENADRYL) injection 25 mg (has no administration in time range)  docusate sodium (COLACE) capsule 100 mg (has no administration in time range)  polyethylene glycol (MIRALAX / GLYCOLAX) packet 17 g (has no administration in time range)  ondansetron (ZOFRAN-ODT) disintegrating tablet 4 mg (has no administration in time range)    Or  ondansetron (ZOFRAN) injection 4 mg (has no administration in time range)  simethicone (MYLICON) chewable tablet 40 mg (has no administration in time range)  metoprolol tartrate (LOPRESSOR) injection 5 mg (has no administration in time range)  pantoprazole (PROTONIX) injection 40 mg (has no administration in time range)  alum & mag hydroxide-simeth (MAALOX/MYLANTA) 200-200-20 MG/5ML suspension 15 mL (has no administration in time range)  sodium chloride 0.9 % bolus 1,000 mL (1,000 mLs Intravenous Bolus 10/13/18 0847)  pantoprazole (PROTONIX) injection 40 mg (40 mg Intravenous Given 10/13/18 0848)  alum & mag hydroxide-simeth (MAALOX/MYLANTA) 200-200-20 MG/5ML suspension 15 mL (15 mLs Oral Given 10/13/18 1045)  simethicone (MYLICON) chewable tablet 80 mg (80 mg Oral Given 10/13/18 1045)    Mobility walks

## 2018-10-13 NOTE — ED Notes (Signed)
Surgery PA at bedside updating patient on POC. Delay in patient transport to floor.

## 2018-10-13 NOTE — ED Triage Notes (Signed)
Patient states she had a cholecystectomy 2 days ago. Patient c/o having a fever 103.0 at home. Patient states she took Tylenol ES at 0700 today. Patient reports acid reflux since 0100 today. Patient is [redacted] weeks pregnant.

## 2018-10-13 NOTE — ED Notes (Signed)
US at bedside

## 2018-10-13 NOTE — ED Provider Notes (Signed)
[facility]-EMERGENCY DEPT Provider Note   CSN: [id number] Arrival date & time: 10/13/18  0747    History   Chief Complaint Chief Complaint  Patient presents with  . Fever  . Gastroesophageal Reflux    HPI Taylor Luna is a 38 y.o. female.     HPI   37yF with abdominal pain and fever. 2d post-op from lap cholecystectomy for cholecystitis. Also 5w pregnant. Discharged yesterday and reports feeling ok. Very early this morning she began having epigastric pain. Describes burning sensation coming up into chest. "I felt like I need to vomit but didn't feel nauseated."  Reports fever to 103 this morning. Took tylenol. Last dose of pain medication was last night. Occasional cough. No dyspnea. No urinary complaints. Hasn't had BM since procedure.   Past Medical History:  Diagnosis Date  . Cholecystitis 09/2018  . Depression with anxiety   . Dizziness   . Pregnancy 2008   second pregnancy Jan 2020    Patient Active Problem List   Diagnosis Date Noted  . Acute cholecystitis 10/10/2018  . Chronic constipation 06/10/2014    Past Surgical History:  Procedure Laterality Date  . CHOLECYSTECTOMY N/A 10/11/2018   Procedure: LAPAROSCOPIC CHOLECYSTECTOMY;  Surgeon: Romie Levee, MD;  Location: [facility] ORS;  Service: General;  Laterality: N/A;     OB History    Gravida  1   Para      Term      Preterm      AB      Living        SAB      TAB      Ectopic      Multiple      Live Births               Home Medications    Prior to Admission medications   Medication Sig Start Date End Date Taking? Authorizing Provider  acetaminophen (TYLENOL) 325 MG tablet Take 2 tablets (650 mg total) by mouth every 6 (six) hours as needed for mild pain (or temp > 100). 10/12/18   Maczis, Elmer Sow, PA-C  buPROPion (WELLBUTRIN XL) 300 MG 24 hr tablet Take 300 mg by mouth daily.    [provider]  busPIRone (BUSPAR) 10 MG tablet Take 10 mg by mouth 2  (two) times daily.    [provider]  docusate sodium (COLACE) 100 MG capsule Take 1 capsule (100 mg total) by mouth 2 (two) times daily as needed for mild constipation. 10/12/18   Maczis, Elmer Sow, PA-C  DYMISTA 137-50 MCG/ACT SUSP Place 1 spray into both nostrils 2 (two) times daily. 09/25/18   [provider]  famotidine (PEPCID) 20 MG tablet Take 1 tablet (20 mg total) by mouth 2 (two) times daily. 10/10/18   Palumbo, April, MD  oxyCODONE (OXY IR/ROXICODONE) 5 MG immediate release tablet Take 1 tablet (5 mg total) by mouth every 6 (six) hours as needed for severe pain or breakthrough pain. 10/12/18   Maczis, Elmer Sow, PA-C  polyethylene glycol (MIRALAX / GLYCOLAX) packet Take 17 g by mouth daily as needed for mild constipation. 10/12/18   Maczis, Elmer Sow, PA-C  potassium chloride SA (K-DUR,KLOR-CON) 20 MEQ tablet Take 1 tablet (20 mEq total) by mouth 2 (two) times daily. 10/10/18   Palumbo, April, MD  Prenat-Fe Carbonyl-FA-Omega 3 (ONE-A-DAY WOMENS PRENATAL 1) 28-0.8-235 MG CAPS Take 1 capsule by mouth daily.    [provider]  promethazine (PHENERGAN) 12.5 MG tablet  Take 1 tablet (12.5 mg total) by mouth every 6 (six) hours as needed for nausea or vomiting. 10/10/18   Palumbo, April, MD    Family History Family History  Problem Relation Age of Onset  . Lupus Mother     Social History Social History   Tobacco Use  . Smoking status: Passive Smoke Exposure - Never Smoker  . Smokeless tobacco: Never Used  Substance Use Topics  . Alcohol use: No  . Drug use: No     Allergies   Erythromycin and Cefdinir   Review of Systems Review of Systems  All systems reviewed and negative, other than as noted in HPI.  Physical Exam Updated Vital Signs BP 104/70 (BP Location: Left Arm)   Pulse (!) 108   Temp 98.3 F (36.8 C) (Oral)   Resp 16   Ht  (1.6 m)   Wt 51.3 kg   LMP 09/09/2018 (Exact Date) Comment: 4.[redacted] weeks pregnant  SpO2 100%   BMI 20.02 kg/m    Physical Exam Vitals signs and nursing note reviewed.  Constitutional:      General: She is not in acute distress.    Appearance: She is well-developed.  HENT:     Head: Normocephalic and atraumatic.  Eyes:     General:        Right eye: No discharge.        Left eye: No discharge.     Conjunctiva/sclera: Conjunctivae normal.  Neck:     Musculoskeletal: Neck supple.  Cardiovascular:     Rate and Rhythm: Regular rhythm. Tachycardia present.     Heart sounds: Normal heart sounds. No murmur. No friction rub. No gallop.      Comments: Mild tachycardia Pulmonary:     Effort: Pulmonary effort is normal. No respiratory distress.     Breath sounds: Normal breath sounds.  Abdominal:     General: There is no distension.     Palpations: Abdomen is soft.     Tenderness: There is abdominal tenderness.     Comments: Abdomen flat. Incisions cdi. Diffuse tenderness w/ peritonitis.   Musculoskeletal:        General: No tenderness.  Skin:    General: Skin is warm and dry.  Neurological:     Mental Status: She is alert.  Psychiatric:        Behavior: Behavior normal.        Thought Content: Thought content normal.      ED Treatments / Results  Labs (all labs ordered are listed, but only abnormal results are displayed) Labs Reviewed  CBC WITH DIFFERENTIAL/PLATELET - Abnormal; Notable for the following components:      Result Value   RBC 3.41 (*)    Hemoglobin 11.1 (*)    HCT 33.3 (*)    All other components within normal limits  COMPREHENSIVE METABOLIC PANEL - Abnormal; Notable for the following components:   Sodium 133 (*)    Potassium 3.1 (*)    CO2 19 (*)    Glucose, Bld 111 (*)    BUN 5 (*)    Calcium 8.7 (*)    All other components within normal limits  LIPASE, BLOOD - Abnormal; Notable for the following components:   Lipase 66 (*)    All other components within normal limits  URINALYSIS, ROUTINE W REFLEX MICROSCOPIC  INFLUENZA PANEL BY PCR (TYPE A & B)     EKG None  Radiology No results found.   Dg Chest 2 View  Result  Date: 10/13/2018 CLINICAL DATA:  Fever EXAM: CHEST - 2 VIEW COMPARISON:  September 25 2018 FINDINGS: There is no appreciable edema or consolidation. The heart size and pulmonary vascularity are normal. No adenopathy. There is thoracolumbar levoscoliosis. There are surgical clips in the upper abdomen the right. IMPRESSION: No edema or consolidation.  No adenopathy evident. Electronically Signed   By: Bretta Bang III M.D.   On: 10/13/2018 09:31   Dg Chest 2 View  Result Date: 09/25/2018 CLINICAL DATA:  Patient diagnosed with influenza B. Worsening shortness of breath. EXAM: CHEST - 2 VIEW COMPARISON:  None. FINDINGS: Cardiomediastinal silhouette is normal. Mediastinal contours appear intact. There is no evidence of pleural effusion or pneumothorax. Peribronchial airspace consolidation in the right middle lobe and less so right lower lobe. Osseous structures are without acute abnormality. Soft tissues are grossly normal. IMPRESSION: Subtle peribronchial airspace consolidation in the right middle lobe and less or right lower lobe, suggestive of early bronchopneumonia. Electronically Signed   By: Ted Mcalpine M.D.   On: 09/25/2018 21:17   US Abdomen Complete  Result Date: 10/10/2018 CLINICAL DATA:  Abdominal pain with nausea and vomiting EXAM: ABDOMEN ULTRASOUND COMPLETE COMPARISON:  May 25, 2014 FINDINGS: Gallbladder: Within the gallbladder, there are echogenic foci which move and shadow, largest measuring 1.8 cm. Gallbladder wall is mildly thickened and subtly edematous with slight pericholecystic fluid. No sonographic Murphy sign noted by sonographer. Common bile duct: Diameter: 5 mm. No intrahepatic, common hepatic, or common bile duct dilatation. Liver: No focal lesion identified. Within normal limits in parenchymal echogenicity. Portal vein is patent on color Doppler imaging with normal direction of blood flow  towards the liver. IVC: No abnormality visualized. Pancreas: No pancreatic mass or inflammatory focus. Spleen: Size and appearance within normal limits. Right Kidney: Length: 11.0 cm. Echogenicity within normal limits. No mass or hydronephrosis visualized. Left Kidney: Length: 10.4 cm. Echogenicity within normal limits. No mass or hydronephrosis visualized. Abdominal aorta: No aneurysm visualized. Other findings: No demonstrable ascites. There is fluid in bowel adjacent to the inflamed gallbladder. IMPRESSION: Cholelithiasis with mildly thickened gallbladder wall and slight pericholecystic fluid. These are findings concerning for a degree of acute cholecystitis. Study otherwise unremarkable. Electronically Signed   By: Bretta Bang III M.D.   On: 10/10/2018 09:20   US Abdomen Limited Ruq  Result Date: 10/13/2018 CLINICAL DATA:  Fever with epigastric pain.  Recent cholecystectomy EXAM: ULTRASOUND ABDOMEN LIMITED RIGHT UPPER QUADRANT COMPARISON:  October 10, 2018 FINDINGS: Gallbladder: The gallbladder is absent. There is echogenic material within the gallbladder fossa which persists throughout the study and does not show demonstrable peristalsis. Common bile duct: Diameter: 4 mm. No intrahepatic or extrahepatic biliary duct dilatation. Liver: No focal lesion identified. Within normal limits in parenchymal echogenicity. Portal vein is patent on color Doppler imaging with normal direction of blood flow towards the liver. IMPRESSION: Gallbladder absent. There is echogenic material within the gallbladder fossa which does not show convincing peristalsis. Question potential hematoma in the gallbladder fossa region. Aperistalsing bowel in this area is also possible. No obvious air in this structure to indicate abscess by ultrasound. Given clinical symptoms and fever in a recent postoperative patient, CT of the gallbladder fossa region may be indicated to further evaluate this region. Study otherwise unremarkable.  Electronically Signed   By: Bretta Bang III M.D.   On: 10/13/2018 10:59    Procedures Procedures (including critical care time)  Medications Ordered in ED Medications  sodium chloride 0.9 % bolus 1,000 mL (has  no administration in time range)  pantoprazole (PROTONIX) injection 40 mg (has no administration in time range)     Initial Impression / Assessment and Plan / ED Course  I have reviewed the triage vital signs and the nursing notes.  Pertinent labs & imaging results that were available during my care of the patient were reviewed by me and considered in my medical decision making (see chart for details).     37yF with upper abdominal pain and fever. S/p lap cholecystectomy 2d ago. Description of pain seems most consistent with reflux. She also reports fever to 103 but currently afebrile. Reports she took tylenol shortly before coming to the ED. Abdominal exam doesn't seem out of proportion in consideration of recent surgery. Did not have cholangiogram during surgery, but trivial elevation in bilirubin prior to procedure was then normal post-op and she never had elevation in transaminases.   Will place IV. Dose of PPI. IVF. Will check labs. Will check UA given brief catherization during hospitalization, check influenza given the time of year and also CXR with occasional cough although her symptoms are not overly suggestive of UTI, flu or pneumonia.   No leukocytosis. Normal LFTs. Lipase elevation is minimal.   Surgery consulted. Admitting for observation.   Final Clinical Impressions(s) / ED Diagnoses   Final diagnoses:  Epigastric pain  Post-op pain    ED Discharge Orders    None       Raeford Razor, MD 10/15/18 1435

## 2018-10-14 LAB — CBC
HCT: 30.5 % — ABNORMAL LOW (ref 36.0–46.0)
Hemoglobin: 9.7 g/dL — ABNORMAL LOW (ref 12.0–15.0)
MCH: 32.3 pg (ref 26.0–34.0)
MCHC: 31.8 g/dL (ref 30.0–36.0)
MCV: 101.7 fL — ABNORMAL HIGH (ref 80.0–100.0)
Platelets: 175 10*3/uL (ref 150–400)
RBC: 3 MIL/uL — AB (ref 3.87–5.11)
RDW: 12.5 % (ref 11.5–15.5)
WBC: 7.1 10*3/uL (ref 4.0–10.5)
nRBC: 0 % (ref 0.0–0.2)

## 2018-10-14 LAB — COMPREHENSIVE METABOLIC PANEL
ALBUMIN: 3.1 g/dL — AB (ref 3.5–5.0)
ALT: 29 U/L (ref 0–44)
AST: 18 U/L (ref 15–41)
Alkaline Phosphatase: 49 U/L (ref 38–126)
Anion gap: 8 (ref 5–15)
BUN: <5 mg/dL — ABNORMAL LOW (ref 6–20)
CO2: 15 mmol/L — ABNORMAL LOW (ref 22–32)
Calcium: 8 mg/dL — ABNORMAL LOW (ref 8.9–10.3)
Chloride: 113 mmol/L — ABNORMAL HIGH (ref 98–111)
Creatinine, Ser: 0.43 mg/dL — ABNORMAL LOW (ref 0.44–1.00)
GFR calc Af Amer: >60 mL/min (ref >60)
GFR calc non Af Amer: >60 mL/min (ref >60)
Glucose, Bld: 66 mg/dL — ABNORMAL LOW (ref 70–99)
POTASSIUM: 3.4 mmol/L — AB (ref 3.5–5.1)
Sodium: 136 mmol/L (ref 135–145)
Total Bilirubin: 0.8 mg/dL (ref 0.3–1.2)
Total Protein: 6 g/dL — ABNORMAL LOW (ref 6.5–8.1)

## 2018-10-14 MED ORDER — BISACODYL 10 MG RE SUPP
10.0000 mg | Freq: Once | RECTAL | Status: AC
  Administered 2018-10-14: 10 mg via RECTAL
  Filled 2018-10-14: qty 1

## 2018-10-14 NOTE — Discharge Instructions (Signed)

## 2018-10-14 NOTE — Plan of Care (Signed)
Discharge instructions reviewed with patient; copy given. IV removed. Patient ready for discharge.  

## 2018-10-14 NOTE — Discharge Summary (Signed)
Patient ID: Taylor Luna [id number] 37 y.o. 04/25/1981  10/13/2018  Discharge date and time: No discharge date for patient encounter.  Admitting Physician: Vanita Panda  Discharge Physician: Vanita Panda  Admission Diagnoses: Epigastric pain [R10.13]  Discharge Diagnoses: ileus, constipation  Operations: none    Discharged Condition: good    Hospital Course: Pt admitted for observation since she was pregnant and we could not get radiologic studies to rule out complications.  Over 24 hrs she had no fevers and her pain resolves.  Repeat labwork stable.  Pt determined to have post op ileus which is not unexpected in a post surgical pt with chronic constipation.    Consults: None  Significant Diagnostic Studies: labs: cbc, cmp, RUQ [place]  Treatments: IV hydration  Disposition: Home

## 2018-10-14 NOTE — Plan of Care (Signed)
Patient sitting up in chair this morning. Complains of pain and would like medication. Will continue to monitor.

## 2018-10-14 NOTE — Progress Notes (Signed)
[facility] Progress Note     Subjective: CC-  Pt with nausea and subjective vomiting overnight.  Overall feels better this AM.  Having some belching.  Objective: Vital signs in last 24 hours: Temp:  [97.6 F (36.4 C)-98.8 F (37.1 C)] 98.2 F (36.8 C) (02/29 0822) Pulse Rate:  [86-100] 92 (02/29 0509) Resp:  [17-18] 18 (02/29 0509) BP: (97-124)/(62-87) 97/62 (02/29 0509) SpO2:  [97 %-100 %] 97 % (02/29 0509) Last BM Date: 10/09/18  Intake/Output from previous day: 02/28 0701 - 02/29 0700 In: 1768.5 [P.O.:240; I.V.:1528.5] Out: 1102 [Urine:1102] Intake/Output this shift: No intake/output data recorded.  PE: Gen:  Alert, NAD, pleasant HEENT: EOM's intact, pupils equal and round Pulm:  CTAB, effort normal Abd: Soft, ND, lap incisions cdi without erythema or drainage, no peritonitis Psych: A&Ox3  Skin: no rashes noted, warm and dry  Lab Results:  Recent Labs    10/13/18 0816 10/14/18 0438  WBC 9.2 7.1  HGB 11.1* 9.7*  HCT 33.3* 30.5*  PLT 200 175   BMET Recent Labs    10/13/18 0816 10/14/18 0438  NA 133* 136  K 3.1* 3.4*  CL 106 113*  CO2 19* 15*  GLUCOSE 111* 66*  BUN 5* <5*  CREATININE 0.45 0.43*  CALCIUM 8.7* 8.0*   PT/INR No results for input(s): LABPROT, INR in the last 72 hours. CMP     Component Value Date/Time   NA 136 10/14/2018 0438   K 3.4 (L) 10/14/2018 0438   CL 113 (H) 10/14/2018 0438   CO2 15 (L) 10/14/2018 0438   GLUCOSE 66 (L) 10/14/2018 0438   BUN <5 (L) 10/14/2018 0438   CREATININE 0.43 (L) 10/14/2018 0438   CALCIUM 8.0 (L) 10/14/2018 0438   PROT 6.0 (L) 10/14/2018 0438   ALBUMIN 3.1 (L) 10/14/2018 0438   AST 18 10/14/2018 0438   ALT 29 10/14/2018 0438   ALKPHOS 49 10/14/2018 0438   BILITOT 0.8 10/14/2018 0438   GFRNONAA >60 10/14/2018 0438   GFRAA >60 10/14/2018 0438   Lipase     Component Value Date/Time   LIPASE 66 (H) 10/13/2018 0816       Studies/Results: Dg Chest 2 View  Result Date:  10/13/2018 CLINICAL DATA:  Fever EXAM: CHEST - 2 VIEW COMPARISON:  September 25 2018 FINDINGS: There is no appreciable edema or consolidation. The heart size and pulmonary vascularity are normal. No adenopathy. There is thoracolumbar levoscoliosis. There are surgical clips in the upper abdomen the right. IMPRESSION: No edema or consolidation.  No adenopathy evident. Electronically Signed   By: Bretta Bang III M.D.   On: 10/13/2018 09:31   US Abdomen Limited Ruq  Result Date: 10/13/2018 CLINICAL DATA:  Fever with epigastric pain.  Recent cholecystectomy EXAM: ULTRASOUND ABDOMEN LIMITED RIGHT UPPER QUADRANT COMPARISON:  October 10, 2018 FINDINGS: Gallbladder: The gallbladder is absent. There is echogenic material within the gallbladder fossa which persists throughout the study and does not show demonstrable peristalsis. Common bile duct: Diameter: 4 mm. No intrahepatic or extrahepatic biliary duct dilatation. Liver: No focal lesion identified. Within normal limits in parenchymal echogenicity. Portal vein is patent on color Doppler imaging with normal direction of blood flow towards the liver. IMPRESSION: Gallbladder absent. There is echogenic material within the gallbladder fossa which does not show convincing peristalsis. Question potential hematoma in the gallbladder fossa region. Aperistalsing bowel in this area is also possible. No obvious air in this structure to indicate abscess by ultrasound. Given clinical symptoms and fever in a  recent postoperative patient, CT of the gallbladder fossa region may be indicated to further evaluate this region. Study otherwise unremarkable. Electronically Signed   By: Bretta Bang III M.D.   On: 10/13/2018 10:59    Anti-infectives: Anti-infectives (From admission, onward)   None       Assessment/Plan About [redacted] weeks pregnant  Cholecystitis s/p lap chole 2/26 Dr. Maisie Fus Postop epigastric pain, nausea, fever - POD#3 - Workup thus far fairy reassuring  with normal LFTs and WBC. No changes overnight.  No fevers over last 24 hrs.  CXR and influenza panel negative. U/a neg. RUQ u/s with no large fluid collection. Unable to do any further imaging due to pregnancy.  Given maalox/simethicone yesterday but still not having much bowel function. Will try suppository for today and advance to soft diet.  Encouraged ambulation.  Most likely has a post op ileus.   LOS: 0 days    Vanita Panda, MD  Colorectal and General Surgery [facility]

## 2018-10-31 LAB — OB RESULTS CONSOLE RUBELLA ANTIBODY, IGM: Rubella: IMMUNE

## 2018-10-31 LAB — OB RESULTS CONSOLE HIV ANTIBODY (ROUTINE TESTING): HIV: NONREACTIVE

## 2018-10-31 LAB — OB RESULTS CONSOLE GC/CHLAMYDIA
Chlamydia: NEGATIVE
Gonorrhea: NEGATIVE

## 2018-10-31 LAB — OB RESULTS CONSOLE RPR: RPR: NONREACTIVE

## 2018-10-31 LAB — OB RESULTS CONSOLE ABO/RH: RH Type: NEGATIVE

## 2018-10-31 LAB — OB RESULTS CONSOLE ANTIBODY SCREEN: Antibody Screen: NEGATIVE

## 2018-10-31 LAB — OB RESULTS CONSOLE HEPATITIS B SURFACE ANTIGEN: Hepatitis B Surface Ag: NEGATIVE

## 2019-04-19 LAB — OB RESULTS CONSOLE GBS: GBS: NEGATIVE

## 2019-05-01 ENCOUNTER — Encounter (HOSPITAL_COMMUNITY): Payer: Self-pay | Admitting: *Deleted

## 2019-05-01 ENCOUNTER — Telehealth (HOSPITAL_COMMUNITY): Payer: Self-pay | Admitting: *Deleted

## 2019-05-01 NOTE — Telephone Encounter (Signed)
Preadmission screen  

## 2019-05-02 ENCOUNTER — Encounter (HOSPITAL_COMMUNITY): Payer: Self-pay | Admitting: *Deleted

## 2019-05-11 ENCOUNTER — Other Ambulatory Visit: Payer: Self-pay | Admitting: Obstetrics and Gynecology

## 2019-05-13 ENCOUNTER — Other Ambulatory Visit: Payer: Self-pay

## 2019-05-13 ENCOUNTER — Ambulatory Visit (HOSPITAL_COMMUNITY)
Admission: RE | Admit: 2019-05-13 | Discharge: 2019-05-13 | Disposition: A | Discharge status: Home / Self Care | Payer: BC Managed Care – PPO | Source: Ambulatory Visit | Attending: Obstetrics | Admitting: Obstetrics

## 2019-05-13 DIAGNOSIS — Z20828 Contact with and (suspected) exposure to other viral communicable diseases: Secondary | ICD-10-CM | POA: Diagnosis not present

## 2019-05-13 LAB — SARS CORONAVIRUS 2 (TAT 6-24 HRS): SARS Coronavirus 2: NEGATIVE

## 2019-05-13 NOTE — MAU Note (Signed)
Pt here for PAT covid swab. Denies symptoms. Swab collected.  

## 2019-05-15 ENCOUNTER — Inpatient Hospital Stay (HOSPITAL_COMMUNITY): Payer: BC Managed Care – PPO | Admitting: Anesthesiology

## 2019-05-15 ENCOUNTER — Inpatient Hospital Stay (HOSPITAL_COMMUNITY): Payer: BC Managed Care – PPO

## 2019-05-15 ENCOUNTER — Inpatient Hospital Stay (HOSPITAL_COMMUNITY)
Admission: AD | Admit: 2019-05-15 | Discharge: 2019-05-17 | DRG: 806 | Disposition: A | Discharge status: Home / Self Care | Payer: BC Managed Care – PPO | Attending: Obstetrics and Gynecology | Admitting: Obstetrics and Gynecology

## 2019-05-15 ENCOUNTER — Other Ambulatory Visit: Payer: Self-pay

## 2019-05-15 ENCOUNTER — Encounter (HOSPITAL_COMMUNITY): Payer: Self-pay | Admitting: *Deleted

## 2019-05-15 DIAGNOSIS — O9081 Anemia of the puerperium: Secondary | ICD-10-CM | POA: Diagnosis not present

## 2019-05-15 DIAGNOSIS — Z3A39 39 weeks gestation of pregnancy: Secondary | ICD-10-CM | POA: Diagnosis not present

## 2019-05-15 DIAGNOSIS — Z6791 Unspecified blood type, Rh negative: Secondary | ICD-10-CM | POA: Diagnosis not present

## 2019-05-15 DIAGNOSIS — O365930 Maternal care for other known or suspected poor fetal growth, third trimester, not applicable or unspecified: Principal | ICD-10-CM | POA: Diagnosis present

## 2019-05-15 DIAGNOSIS — F32A Depression, unspecified: Secondary | ICD-10-CM | POA: Diagnosis present

## 2019-05-15 DIAGNOSIS — O26893 Other specified pregnancy related conditions, third trimester: Secondary | ICD-10-CM | POA: Diagnosis present

## 2019-05-15 DIAGNOSIS — Z3490 Encounter for supervision of normal pregnancy, unspecified, unspecified trimester: Secondary | ICD-10-CM | POA: Diagnosis present

## 2019-05-15 DIAGNOSIS — Z23 Encounter for immunization: Secondary | ICD-10-CM

## 2019-05-15 DIAGNOSIS — D62 Acute posthemorrhagic anemia: Secondary | ICD-10-CM | POA: Diagnosis not present

## 2019-05-15 DIAGNOSIS — F329 Major depressive disorder, single episode, unspecified: Secondary | ICD-10-CM | POA: Diagnosis present

## 2019-05-15 DIAGNOSIS — F419 Anxiety disorder, unspecified: Secondary | ICD-10-CM | POA: Diagnosis present

## 2019-05-15 LAB — CBC
HCT: 35.4 % — ABNORMAL LOW (ref 36.0–46.0)
Hemoglobin: 11.7 g/dL — ABNORMAL LOW (ref 12.0–15.0)
MCH: 33.2 pg (ref 26.0–34.0)
MCHC: 33.1 g/dL (ref 30.0–36.0)
MCV: 100.6 fL — ABNORMAL HIGH (ref 80.0–100.0)
Platelets: 219 10*3/uL (ref 150–400)
RBC: 3.52 MIL/uL — ABNORMAL LOW (ref 3.87–5.11)
RDW: 12.3 % (ref 11.5–15.5)
WBC: 8.9 10*3/uL (ref 4.0–10.5)
nRBC: 0 % (ref 0.0–0.2)

## 2019-05-15 LAB — RPR: RPR Ser Ql: NONREACTIVE — AB

## 2019-05-15 MED ORDER — OXYTOCIN 40 UNITS IN NORMAL SALINE INFUSION - SIMPLE MED
1.0000 m[IU]/min | INTRAVENOUS | Status: DC
  Administered 2019-05-15: 08:00:00 2 m[IU]/min via INTRAVENOUS
  Filled 2019-05-15: qty 1000

## 2019-05-15 MED ORDER — LACTATED RINGERS IV SOLN: 500.0000 mL | INTRAVENOUS | Status: DC | PRN

## 2019-05-15 MED ORDER — FENTANYL-BUPIVACAINE-NACL 0.5-0.125-0.9 MG/250ML-% EP SOLN
12.0000 mL/h | EPIDURAL | Status: DC | PRN
  Filled 2019-05-15: qty 250

## 2019-05-15 MED ORDER — ONDANSETRON HCL 4 MG/2ML IJ SOLN
4.0000 mg | Freq: Four times a day (QID) | INTRAMUSCULAR | Status: DC | PRN
  Administered 2019-05-15: 4 mg via INTRAVENOUS
  Filled 2019-05-15: qty 2

## 2019-05-15 MED ORDER — SOD CITRATE-CITRIC ACID 500-334 MG/5ML PO SOLN: 30.0000 mL | ORAL | Status: DC | PRN

## 2019-05-15 MED ORDER — EPHEDRINE 5 MG/ML INJ: 10.0000 mg | INTRAVENOUS | Status: DC | PRN

## 2019-05-15 MED ORDER — ACETAMINOPHEN 325 MG PO TABS: 650.0000 mg | ORAL_TABLET | ORAL | Status: DC | PRN

## 2019-05-15 MED ORDER — PHENYLEPHRINE 40 MCG/ML (10ML) SYRINGE FOR IV PUSH (FOR BLOOD PRESSURE SUPPORT): 80.0000 ug | PREFILLED_SYRINGE | INTRAVENOUS | Status: DC | PRN

## 2019-05-15 MED ORDER — LIDOCAINE HCL (PF) 1 % IJ SOLN
INTRAMUSCULAR | Status: DC | PRN
  Administered 2019-05-15: 11 mL via EPIDURAL

## 2019-05-15 MED ORDER — SODIUM CHLORIDE (PF) 0.9 % IJ SOLN
INTRAMUSCULAR | Status: DC | PRN
  Administered 2019-05-15: 12 mL/h via EPIDURAL

## 2019-05-15 MED ORDER — LACTATED RINGERS IV SOLN: 500.0000 mL | Freq: Once | INTRAVENOUS | Status: DC

## 2019-05-15 MED ORDER — METHYLERGONOVINE MALEATE 0.2 MG/ML IJ SOLN: 0.2000 mg | Freq: Once | INTRAMUSCULAR | Status: DC

## 2019-05-15 MED ORDER — DIPHENHYDRAMINE HCL 50 MG/ML IJ SOLN: 12.5000 mg | INTRAMUSCULAR | Status: DC | PRN

## 2019-05-15 MED ORDER — OXYTOCIN 40 UNITS IN NORMAL SALINE INFUSION - SIMPLE MED
2.5000 [IU]/h | INTRAVENOUS | Status: DC
  Administered 2019-05-15: 2.5 [IU]/h via INTRAVENOUS

## 2019-05-15 MED ORDER — OXYTOCIN BOLUS FROM INFUSION
500.0000 mL | Freq: Once | INTRAVENOUS | Status: AC
  Administered 2019-05-15: 22:00:00 500 mL via INTRAVENOUS

## 2019-05-15 MED ORDER — METHYLERGONOVINE MALEATE 0.2 MG/ML IJ SOLN
INTRAMUSCULAR | Status: AC
  Administered 2019-05-15: 23:00:00 0.2 mg
  Filled 2019-05-15: qty 1

## 2019-05-15 MED ORDER — TERBUTALINE SULFATE 1 MG/ML IJ SOLN: 0.2500 mg | Freq: Once | INTRAMUSCULAR | Status: DC | PRN

## 2019-05-15 MED ORDER — LIDOCAINE HCL (PF) 1 % IJ SOLN: 30.0000 mL | INTRAMUSCULAR | Status: DC | PRN

## 2019-05-15 MED ORDER — LACTATED RINGERS IV SOLN
INTRAVENOUS | Status: DC
  Administered 2019-05-15 (×3): via INTRAVENOUS

## 2019-05-15 NOTE — Anesthesia Procedure Notes (Signed)
Epidural Patient location during procedure: OB Start time: 05/15/2019 9:08 AM End time: 05/15/2019 9:19 AM  Staffing Anesthesiologist: Lynda Rainwater, MD Performed: anesthesiologist   Preanesthetic Checklist Completed: patient identified, site marked, surgical consent, pre-op evaluation, timeout performed, IV checked, risks and benefits discussed and monitors and equipment checked  Epidural Patient position: sitting Prep: ChloraPrep Patient monitoring: heart rate, cardiac monitor, continuous pulse ox and blood pressure Approach: midline Location: L2-L3 Injection technique: LOR saline  Needle:  Needle type: Tuohy  Needle gauge: 17 G Needle length: 9 cm Needle insertion depth: 4 cm Catheter type: closed end flexible Catheter size: 20 Guage Catheter at skin depth: 8 cm Test dose: negative  Assessment Events: blood not aspirated, injection not painful, no injection resistance, negative IV test and no paresthesia  Additional Notes Reason for block:procedure for pain

## 2019-05-15 NOTE — Consult Note (Signed)
Delivery Note   05/15/2019  11:24 PM  Requested by Dr. Ronita Hipps to attend this vaginal delivery for vacuum-assist.  Infant came out crying vigorously so team was excused for the delivery.   Audrea Muscat V.T. Maybelline Kolarik, MD Neonatologist

## 2019-05-15 NOTE — Anesthesia Preprocedure Evaluation (Signed)

## 2019-05-15 NOTE — Progress Notes (Signed)
Taylor Luna is a 38 y.o. G2P1001 at [redacted]w[redacted]d by LMP admitted for induction of labor due to [facility].  Subjective: comfortable  Objective: BP 121/82   Pulse 78   Temp 97.8 F (36.6 C) (Oral)   Ht 5\' 3"  (1.6 m)   Wt 58.2 kg   LMP 09/09/2018 (Exact Date) Comment: 4.[redacted] weeks pregnant, pregnancy waiver signed  SpO2 100%   BMI 22.75 kg/m  No intake/output data recorded. Total I/O In: 1805.8 [I.V.:1805.8] Out: -   FHT:  FHR: 145 bpm, variability: moderate,  accelerations:  Present,  decelerations:  Absent UC:   regular, every 2-3 minutes SVE:   4-5/80/-1  Labs: Lab Results  Component Value Date   WBC 8.9 05/15/2019   HGB 11.7 (L) 05/15/2019   HCT 35.4 (L) 05/15/2019   MCV 100.6 (H) 05/15/2019   PLT 219 05/15/2019    Assessment / Plan: Induction of labor due to [place],  progressing well on pitocin  Labor: Progressing normally Preeclampsia:  no signs or symptoms of toxicity Fetal Wellbeing:  Category I Pain Control:  Epidural I/D:  n/a Anticipated MOD:  NSVD  Taylor Luna J 05/15/2019, 5:35 PM

## 2019-05-15 NOTE — H&P (Signed)
Taylor Luna is a 38 y.o. female presenting for IOL for AMA and ? SGA. OB History    Gravida  2   Para  1   Term  1   Preterm      AB      Living  1     SAB      TAB      Ectopic      Multiple      Live Births  1          Past Medical History:  Diagnosis Date  . Adnexal mass   . Carcinoma in situ of cervix uteri   . Cholecystitis 09/2018  . Depression with anxiety   . Dizziness   . Hx of condyloma acuminatum   . Pregnancy 2008   second pregnancy Jan 2020  . Vaginal Pap smear, abnormal    Past Surgical History:  Procedure Laterality Date  . broken nose  2008   surgery to repair  . CHOLECYSTECTOMY N/A 10/11/2018   Procedure: LAPAROSCOPIC CHOLECYSTECTOMY;  Surgeon: Leighton Ruff, MD;  Location: [facility];  Service: General;  Laterality: N/A;  . LEEP     Family History: family history includes Cancer in her maternal grandfather and maternal grandmother; Diabetes in her maternal grandmother; Hypertension in her father and paternal grandmother; Lupus in her mother. Social History:  reports that she has never smoked. She has never used smokeless tobacco. She reports that she does not drink alcohol or use drugs.     Maternal Diabetes: No Genetic Screening: Normal Maternal Ultrasounds/Referrals: Normal Fetal Ultrasounds or other Referrals:  None Maternal Substance Abuse:  No Significant Maternal Medications:  None Significant Maternal Lab Results:  Group B Strep negative Other Comments:  None  Review of Systems  Constitutional: Negative.   All other systems reviewed and are negative.  Maternal Medical History:  Contractions: Onset was less than 1 hour ago.   Frequency: rare.   Perceived severity is mild.    Fetal activity: Perceived fetal activity is normal.   Last perceived fetal movement was within the past hour.    Prenatal complications: no prenatal complications Prenatal Complications - Diabetes: none.    Dilation: 5.5 Effacement (%):  90 Station: -1 Exam by:: J.Cox, RN Blood pressure 121/82, pulse 78, temperature 97.8 F (36.6 C), temperature source Oral, height 5\' 3"  (1.6 m), weight 58.2 kg, last menstrual period 09/09/2018, SpO2 100 %. Maternal Exam:  Uterine Assessment: Contraction strength is mild.  Contraction frequency is irregular.   Abdomen: Patient reports no abdominal tenderness. Fetal presentation: vertex  Introitus: Normal vulva. Normal vagina.  Ferning test: not done.  Amniotic fluid character: not assessed.  Pelvis: adequate for delivery.   Cervix: Cervix evaluated by digital exam.     Physical Exam  Nursing note and vitals reviewed. Constitutional: She is oriented to person, place, and time. She appears well-developed and well-nourished.  HENT:  Head: Normocephalic and atraumatic.  Neck: Normal range of motion. Neck supple.  Cardiovascular: Normal rate and regular rhythm.  Respiratory: Effort normal and breath sounds normal.  GI: Soft. Bowel sounds are normal.  Genitourinary:    Vulva, vagina and uterus normal.   Musculoskeletal: Normal range of motion.  Neurological: She is alert and oriented to person, place, and time. She has normal reflexes.  Skin: Skin is warm and dry.  Psychiatric: She has a normal mood and affect.    Prenatal labs: ABO, Rh: --/--/O NEG (09/29 0754) Antibody: POS (09/29 0754) Rubella: Immune (  03/17 0000) RPR: Non Reactive (09/29 0754)  HBsAg: Negative (03/17 0000)  HIV: Non-reactive (03/17 0000)  GBS: Negative/-- (09/03 0000)   Assessment/Plan: 39 + week IUP ? SGA AMA IOL   Adonus Uselman J 05/15/2019, 5:33 PM

## 2019-05-16 ENCOUNTER — Encounter (HOSPITAL_COMMUNITY): Payer: Self-pay

## 2019-05-16 DIAGNOSIS — F329 Major depressive disorder, single episode, unspecified: Secondary | ICD-10-CM | POA: Diagnosis present

## 2019-05-16 DIAGNOSIS — F419 Anxiety disorder, unspecified: Secondary | ICD-10-CM | POA: Diagnosis present

## 2019-05-16 DIAGNOSIS — Z6791 Unspecified blood type, Rh negative: Secondary | ICD-10-CM

## 2019-05-16 LAB — CBC
HCT: 29.9 % — ABNORMAL LOW (ref 36.0–46.0)
Hemoglobin: 10.4 g/dL — ABNORMAL LOW (ref 12.0–15.0)
MCH: 34.1 pg — ABNORMAL HIGH (ref 26.0–34.0)
MCHC: 34.8 g/dL (ref 30.0–36.0)
MCV: 98 fL (ref 80.0–100.0)
Platelets: 157 10*3/uL (ref 150–400)
RBC: 3.05 MIL/uL — ABNORMAL LOW (ref 3.87–5.11)
RDW: 12.1 % (ref 11.5–15.5)
WBC: 18.2 10*3/uL — ABNORMAL HIGH (ref 4.0–10.5)
nRBC: 0 % (ref 0.0–0.2)

## 2019-05-16 MED ORDER — WITCH HAZEL-GLYCERIN EX PADS: 1.0000 "application " | MEDICATED_PAD | CUTANEOUS | Status: DC | PRN

## 2019-05-16 MED ORDER — OXYCODONE-ACETAMINOPHEN 5-325 MG PO TABS: 2.0000 | ORAL_TABLET | ORAL | Status: DC | PRN

## 2019-05-16 MED ORDER — INFLUENZA VAC SPLIT QUAD 0.5 ML IM SUSY
0.5000 mL | PREFILLED_SYRINGE | INTRAMUSCULAR | Status: AC
  Administered 2019-05-16: 17:00:00 0.5 mL via INTRAMUSCULAR
  Filled 2019-05-16: qty 0.5

## 2019-05-16 MED ORDER — DIPHENHYDRAMINE HCL 25 MG PO CAPS: 25.0000 mg | ORAL_CAPSULE | Freq: Four times a day (QID) | ORAL | Status: DC | PRN

## 2019-05-16 MED ORDER — RHO D IMMUNE GLOBULIN 1500 UNIT/2ML IJ SOSY
300.0000 ug | PREFILLED_SYRINGE | Freq: Once | INTRAMUSCULAR | Status: AC
  Administered 2019-05-16: 300 ug via INTRAVENOUS
  Filled 2019-05-16: qty 2

## 2019-05-16 MED ORDER — BENZOCAINE-MENTHOL 20-0.5 % EX AERO: 1.0000 "application " | INHALATION_SPRAY | CUTANEOUS | Status: DC | PRN

## 2019-05-16 MED ORDER — PRENATAL MULTIVITAMIN CH
1.0000 | ORAL_TABLET | Freq: Every day | ORAL | Status: DC
  Administered 2019-05-16 – 2019-05-17 (×2): 1 via ORAL
  Filled 2019-05-16 (×2): qty 1

## 2019-05-16 MED ORDER — DIBUCAINE (PERIANAL) 1 % EX OINT: 1.0000 "application " | TOPICAL_OINTMENT | CUTANEOUS | Status: DC | PRN

## 2019-05-16 MED ORDER — METHYLERGONOVINE MALEATE 0.2 MG PO TABS: 0.2000 mg | ORAL_TABLET | ORAL | Status: DC | PRN

## 2019-05-16 MED ORDER — TETANUS-DIPHTH-ACELL PERTUSSIS 5-2.5-18.5 LF-MCG/0.5 IM SUSP: 0.5000 mL | Freq: Once | INTRAMUSCULAR | Status: DC

## 2019-05-16 MED ORDER — COCONUT OIL OIL
1.0000 "application " | TOPICAL_OIL | Status: DC | PRN
  Administered 2019-05-17: 1 via TOPICAL

## 2019-05-16 MED ORDER — METHYLERGONOVINE MALEATE 0.2 MG/ML IJ SOLN: 0.2000 mg | INTRAMUSCULAR | Status: DC | PRN

## 2019-05-16 MED ORDER — SENNOSIDES-DOCUSATE SODIUM 8.6-50 MG PO TABS
2.0000 | ORAL_TABLET | ORAL | Status: DC
  Administered 2019-05-16: 2 via ORAL
  Filled 2019-05-16: qty 2

## 2019-05-16 MED ORDER — SIMETHICONE 80 MG PO CHEW: 80.0000 mg | CHEWABLE_TABLET | ORAL | Status: DC | PRN

## 2019-05-16 MED ORDER — ONDANSETRON HCL 4 MG PO TABS: 4.0000 mg | ORAL_TABLET | ORAL | Status: DC | PRN

## 2019-05-16 MED ORDER — BUPROPION HCL ER (XL) 300 MG PO TB24
300.0000 mg | ORAL_TABLET | Freq: Every day | ORAL | Status: DC
  Administered 2019-05-16 – 2019-05-17 (×2): 300 mg via ORAL
  Filled 2019-05-16 (×5): qty 1

## 2019-05-16 MED ORDER — BUSPIRONE HCL 5 MG PO TABS
10.0000 mg | ORAL_TABLET | Freq: Two times a day (BID) | ORAL | Status: DC
  Administered 2019-05-16 – 2019-05-17 (×3): 10 mg via ORAL
  Filled 2019-05-16 (×3): qty 2

## 2019-05-16 MED ORDER — ACETAMINOPHEN 325 MG PO TABS: 650.0000 mg | ORAL_TABLET | ORAL | Status: DC | PRN

## 2019-05-16 MED ORDER — IBUPROFEN 600 MG PO TABS
600.0000 mg | ORAL_TABLET | Freq: Four times a day (QID) | ORAL | Status: DC
  Administered 2019-05-16 – 2019-05-17 (×6): 600 mg via ORAL
  Filled 2019-05-16 (×6): qty 1

## 2019-05-16 MED ORDER — ZOLPIDEM TARTRATE 5 MG PO TABS: 5.0000 mg | ORAL_TABLET | Freq: Every evening | ORAL | Status: DC | PRN

## 2019-05-16 MED ORDER — ONDANSETRON HCL 4 MG/2ML IJ SOLN: 4.0000 mg | INTRAMUSCULAR | Status: DC | PRN

## 2019-05-16 MED ORDER — OXYCODONE-ACETAMINOPHEN 5-325 MG PO TABS: 1.0000 | ORAL_TABLET | ORAL | Status: DC | PRN

## 2019-05-16 NOTE — Progress Notes (Signed)
MOB was referred for history of depression/anxiety.  * Referral screened out by Clinical Social Worker because none of the following criteria appear to apply:  ~ History of anxiety/depression during this pregnancy, or of post-partum depression following prior delivery. ~ Diagnosis of anxiety and/or depression within last 3 years. Per chart review, MOB's anxiety/depression date back to 2013. No concerns noted in [facility] records. OR * MOB's symptoms currently being treated with medication and/or therapy. MOB currently taking Buspar and Welbutrin.   Please contact the Clinical Social Worker if needs arise, by [facility] request, or if MOB scores greater than 9/yes to question 10 on Edinburgh Postpartum Depression Screen.  Elijio Miles, [place]  Women's and [employer] [phone]

## 2019-05-16 NOTE — Progress Notes (Addendum)
Postpartum Day # 1   Delivering provider: Brien Few  Pain control at delivery: Epidural  Episiotomy:None  Lacerations:None   Live born female  Birth Weight: 6 lb 5.9 oz (2890 g) APGAR: 8, 9  Newborn Delivery   Birth date/time: 05/[birth year]/2019 23:15:00 Delivery type: Vaginal, Vacuum (Extractor)      Feeding: breast  S:  Reports feeling well.             Tolerating po/ No nausea or vomiting             Bleeding is light             Pain controlled with prescription NSAID's including ibuprofen             Up ad lib / ambulatory / voiding without difficulties   O:  A & O x 3, in no apparent distress              VS:  Vitals:   05/16/19 0045 05/16/19 0123 05/16/19 0230 05/16/19 0630  BP: 127/80 129/78 114/72 119/80  Pulse: 65 68 63 70  Resp: 18 18 18 18   Temp:  97.9 F (36.6 C) 98.4 F (36.9 C) 97.8 F (36.6 C)  TempSrc:  Oral Oral Oral  SpO2:  99% 99% 100%  Weight:      Height:        LABS:  Recent Labs    05/15/19 0754 05/16/19 0513  WBC 8.9 18.2*  HGB 11.7* 10.4*  HCT 35.4* 29.9*  PLT 219 157    Blood type: --/--/O NEG (09/29 0754)  Rubella: Immune (03/17 0000)   I&O: I/O last 3 completed shifts: In: 1805.8 [I.V.:1805.8] Out: 0865 [Urine:1400; Blood:391]          No intake/output data recorded.  Vaccines: TDaP UTD         Flu    Ordered   Lungs: Clear and unlabored  Heart: regular rate and rhythm / no murmurs  Abdomen: soft, non-tender, non-distended             Fundus: firm, non-tender, U-1  Perineum: intact, no edema noted  Lochia: light  Extremities: No edema, no calf pain or tenderness    A/P: PPD # 1 38 y.o., [id number]   Principal Problem:   Postpartum care following vaginal delivery (9/29) Active Problems:   Encounter for induction of labor   Doing well - stable status  Routine post partum orders  Breastfeeding support given  Encouraged ambulation  Anticipate discharge tomorrow   Juanna Cao, SNM, BSN 05/16/2019, 9:19 AM     Medical screening examination/treatment/procedure(s) were conducted as a shared visit with non-physician practitioner(s) and myself.  I personally evaluated the patient during the encounter.   Rh negative mother with Rh positive infant, will give Rhogam prior to DC.   Juliene Pina, CNM, MSN 05/16/2019, 12:46 PM

## 2019-05-16 NOTE — Lactation Note (Addendum)
This note was copied from a baby's chart. Lactation Consultation Note  Patient Name: Taylor Luna QQPYP'P Date: 05/16/2019 Reason for consult: Initial assessment;Term P2, 6 hour female infant, DAT+ Tools given: breast shells and hand pump due to mom having semi flat and inverted nipple on right breast.  Infant had 2 voids since birth and LC changed a void diaper while in room. This is mom's first time breastfeeding she did not breastfed her 36 year old daughter. Per mom, infant did not latch in L&D been sleepy. Mom taught back hand expression and infant was given 2 ml of colostrum by spoon. LC noticed mom had flat and invert nipple on right breast. LC asked mom to pre-pump breast prior to latching infant, mom attempted to latch infant on left breast using cross cradle hold, infant took only a few swallows mostly held breast in mouth. LC tried suck training infant was not suckling on gloved finger with coordinated sucking pattern at this time. Mom hand expressed and infant was given additional 4 ml of colostrum by spoon infant was given a total of 6 ml of colostrum by spoon. Mom will continue to work towards latching infant to breast.  Mom shown how to use hand pump  & how to disassemble, clean, & reassemble parts. Mom will continue to work towards latching infant to breast and if infant is reluctant to latch mom will hand express, giving infant back EBM 0-24 hours based on age/ hours of life 507 ml per feeding. Mom knows to breastfeed according hunger cues, 8 to 12 times within 24 hours and on demand. Mom knows to call Nurse or [place] if she has any questions, concerns or need assistance with latching infant to breast. Reviewed Baby & Me book's Breastfeeding Basics.  Mom made aware of O/P services, breastfeeding support groups, community resources, and our phone # for post-discharge questions.   Maternal Data Formula Feeding for Exclusion: No Has patient been taught Hand Expression?:  Yes(Infat was given 6 ml of colostrum by spoon.) Does the patient have breastfeeding experience prior to this delivery?: No  Feeding Feeding Type: Breast Fed  LATCH Score Latch: Too sleepy or reluctant, no latch achieved, no sucking elicited.  Audible Swallowing: A few with stimulation  Type of Nipple: Inverted  Comfort (Breast/Nipple): Soft / non-tender  Hold (Positioning): Assistance needed to correctly position infant at breast and maintain latch.  LATCH Score: 4  Interventions Interventions: Breast feeding basics reviewed;Breast compression;Assisted with latch;Adjust position;Skin to skin;Support pillows;Hand pump;Breast massage;Position options;Hand express;Expressed milk;Pre-pump if needed;Shells  Lactation Tools Discussed/Used Tools: Shells;Pump WIC Program: No Pump Review: Setup, frequency, and cleaning;Milk Storage Initiated by:: Vicente Serene, IBCLC Date initiated:: 05/16/19   Consult Status Consult Status: Follow-up Date: 05/16/19 Follow-up type: In-patient    Vicente Serene 05/16/2019, 6:03 AM

## 2019-05-16 NOTE — Anesthesia Postprocedure Evaluation (Signed)
Anesthesia Post Note  Patient: Taylor Luna  Procedure(s) Performed: AN AD HOC LABOR EPIDURAL     Patient location during evaluation: Mother Baby Anesthesia Type: Epidural Level of consciousness: awake and alert Pain management: pain level controlled Vital Signs Assessment: post-procedure vital signs reviewed and stable Respiratory status: spontaneous breathing, nonlabored ventilation and respiratory function stable Cardiovascular status: stable Postop Assessment: no headache, no backache and epidural receding Anesthetic complications: no    Last Vitals:  Vitals:   05/16/19 0230 05/16/19 0630  BP: 114/72 119/80  Pulse: 63 70  Resp: 18 18  Temp: 36.9 C 36.6 C  SpO2: 99% 100%    Last Pain:  Vitals:   05/16/19 0630  TempSrc: Oral  PainSc: 0-No pain   Pain Goal:                   [name]

## 2019-05-17 LAB — RH IG WORKUP (INCLUDES ABO/RH)
ABO/RH(D): O NEG
Fetal Screen: NEGATIVE
Gestational Age(Wks): 39.3
Unit division: 0

## 2019-05-17 MED ORDER — IBUPROFEN 600 MG PO TABS: 600.0000 mg | ORAL_TABLET | Freq: Four times a day (QID) | ORAL | 0 refills | Status: DC

## 2019-05-17 NOTE — Progress Notes (Signed)
PPD #1, VAVD, intact/no repair, baby girl "Luella"  S:  Reports feeling well, ready to go home             Tolerating po/ No nausea or vomiting / Denies dizziness or SOB             Bleeding is light             Pain controlled with Motrin             Up ad lib / ambulatory / voiding QS without difficulty   Newborn breast feeding - going well  O:               VS: BP 122/80 (BP Location: Right Arm)   Pulse 66   Temp (!) 97.5 F (36.4 C) (Oral)   Resp 17   Ht 5\' 3"  (1.6 m)   Wt 58.2 kg   LMP 09/09/2018 (Exact Date) Comment: 4.[redacted] weeks pregnant, pregnancy waiver signed  SpO2 100%   Breastfeeding Unknown   BMI 22.75 kg/m    LABS:              Recent Labs    05/15/19 0754 05/16/19 0513  WBC 8.9 18.2*  HGB 11.7* 10.4*  PLT 219 157               Blood type: --/--/O NEG (09/30 0513)  Rubella: Immune (03/17 0000)                     I&O: Intake/Output      09/30 0701 - 10/01 0700 10/01 0701 - 10/02 0700   I.V. (mL/kg)     Other     Total Intake(mL/kg)     Urine (mL/kg/hr)     Blood     Total Output     Net                        Physical Exam:             Alert and oriented X3  Lungs: Clear and unlabored  Heart: regular rate and rhythm / no murmurs  Abdomen: soft, non-tender, non-distended              Fundus: firm, non-tender, U-2  Perineum: intact, no edema or erythema   Lochia: small rubra on pad   Extremities: no edema, no calf pain or tenderness    A: PPD # 2, VAVD  ABL Anemia compounding IDA  RH Negative - baby RH positive: s/p Rhogam 9/30  Doing well - stable status  P: Routine post partum orders  Recommend beginning OTC oral FE daily  Discharge home today  WOB discharge book given, instructions and warning s/s reviewed   F/u with Dr. Ronita Hipps in 6 weeks   Lars Pinks, MSN, [facility]

## 2019-05-17 NOTE — Lactation Note (Signed)
This note was copied from a baby's chart. Lactation Consultation Note  Patient Name: Taylor Luna [id number] Date: 05/17/2019 Reason for consult: Follow-up assessment;Term;Primapara;1st time breastfeeding  P1 mother whose infant is now 87 hours old.  Mother had many breast feeding questions when I arrived.  We had a nice discussion and mother feels more comfortable now that her questions have been answered.  Mother is very knowledgeable and has been "googling" breast feeding topics throughout her pregnancy.  She had baby latched to the left breast in the cradle hold when I arrived.  Baby was at the end of her feeding and falling asleep at the breast.  She was swaddled.  Discussed the importance of feeding STS and keeping baby awake and actively feeding.  When baby stops feeding mother will remove her from the breast rather than allowing her to "hang out" or sleep.  Mother informed me that she was told her right nipple was inverted.  She was wearing a breast shell on that side.  Upon assessment, her nipple was not inverted.  It was short shafted.  Mother wanted to try a NS on her right side.  Even though I did not feel it was necessary I brought a NS in and demonstrated how to use it.  Attempted twice to latch baby with the NS but she was not interested.  She pushed back off the breast.  Asked mother if we could attempt to latch without the NS and mother agreeable.  Assisted baby to latch to the right breast easily and she began sucking.  Demonstrated breast compressions and mother able to return demonstrate.  Baby sucked for an additional 10 minutes before becoming too sleepy to continue.  Suggested mother release baby from the breast.    Mother has a DEBP for home use.  Father present and supportive.  Mother has our OP phone number for questions/concerns after discharge.  She was appreciative of help.  RN in room with coconut oil and mother familiar with its use.     Maternal Data Formula  Feeding for Exclusion: No Has patient been taught Hand Expression?: Yes Does the patient have breastfeeding experience prior to this delivery?: No  Feeding Feeding Type: Breast Fed  LATCH Score Latch: Grasps breast easily, tongue down, lips flanged, rhythmical sucking.  Audible Swallowing: A few with stimulation  Type of Nipple: Everted at rest and after stimulation  Comfort (Breast/Nipple): Soft / non-tender  Hold (Positioning): Assistance needed to correctly position infant at breast and maintain latch.  LATCH Score: 8  Interventions Interventions: Breast feeding basics reviewed;Assisted with latch;Skin to skin;Breast massage;Hand express;Breast compression;Hand pump;Shells;Position options;Support pillows;Adjust position  Lactation Tools Discussed/Used Tools: Shells;Pump;Coconut oil Shell Type: Inverted Breast pump type: Manual   Consult Status Consult Status: Complete Date: 05/17/19 Follow-up type: Call as needed    Roizy Harold R Lubna Stegeman 05/17/2019, 10:34 AM

## 2019-05-17 NOTE — Discharge Summary (Signed)
Obstetric Discharge Summary   Patient Name: Taylor Luna DOB: 12/21/1980 MRN: [id number]  Date of Admission: 05/15/2019 Date of Discharge: 05/17/2019 Date of Delivery: 05/15/2019 Gestational Age at Delivery: [redacted]w[redacted]d  Primary OB: [facility] - Dr. Billy Coast  Antepartum complications:  - AMA -?SGA - Depression and anxiety - LEEP Prenatal Labs:  ABO, Rh: --/--/O NEG (09/29 0754) Antibody: POS (09/29 0754) Rubella: Immune (03/17 0000) RPR: Non Reactive (09/29 0754)  HBsAg: Negative (03/17 0000)  HIV: Non-reactive (03/17 0000)  GBS: Negative/-- (09/03 0000)  Admitting Diagnosis: IOL at 39 weeks for AMA, SGA  Secondary Diagnoses: Patient Active Problem List   Diagnosis Date Noted  . Postpartum care following VAVB(9/29) 05/16/2019  . Vacuum-assisted vaginal delivery 05/16/2019  . Anxiety and depression 05/16/2019  . Rh negative, maternal 05/16/2019  . Encounter for induction of labor 05/15/2019    Augmentation: Pitocin and AROM Complications: None  Date of Delivery: 05/15/2019 Delivered By: Dr. Billy Coast Delivery Type: vacuum, low Anesthesia: epidural Placenta: spontaneous Laceration: none Episiotomy: none  Newborn Data: Live born female  Birth Weight: 6 lb 5.9 oz (2890 g) APGAR: 8, 9  Newborn Delivery   Birth date/time: 05/15/2019 23:15:00 Delivery type: Vaginal, Vacuum (Extractor)         Hospital/Postpartum Course  (Vaginal Delivery): Pt. Was admitted at 39+ weeks for IOL for AMA and questionable SGA.  She progressed with Pitocin and AROM.  She delivered by vacuum assisted vaginal delivery.  See notes and delivery summary for details. Patient had an uncomplicated postpartum course.  By time of discharge on PPD#2, her pain was controlled on oral pain medications; she had appropriate lochia and was ambulating, voiding without difficulty and tolerating regular diet.  She was deemed stable for discharge to home.      Labs: CBC Latest Ref Rng & Units 05/16/2019  05/15/2019 10/14/2018  WBC 4.0 - 10.5 K/uL 18.2(H) 8.9 7.1  Hemoglobin 12.0 - 15.0 g/dL 10.4(L) 11.7(L) 9.7(L)  Hematocrit 36.0 - 46.0 % 29.9(L) 35.4(L) 30.5(L)  Platelets 150 - 400 K/uL 157 219 175   O NEG  Physical exam:  BP 122/80 (BP Location: Right Arm)   Pulse 66   Temp (!) 97.5 F (36.4 C) (Oral)   Resp 17   Ht 5\' 3"  (1.6 m)   Wt 58.2 kg   LMP 09/09/2018 (Exact Date) Comment: 4.[redacted] weeks pregnant, pregnancy waiver signed  SpO2 100%   Breastfeeding Unknown   BMI 22.75 kg/m   Alert and oriented X3             Lungs: Clear and unlabored             Heart: regular rate and rhythm / no murmurs             Abdomen: soft, non-tender, non-distended              Fundus: firm, non-tender, U-2             Perineum: intact, no edema or erythema              Lochia: small rubra on pad              Extremities: no edema, no calf pain or tenderness  Disposition: stable, discharge to home Baby Feeding: breast milk Baby Disposition: home with mom  Rh Immune globulin given: 05/16/2019 Rubella vaccine given: N/A Tdap vaccine given in AP or PP setting: UTD  Flu vaccine given in AP or PP setting: given 05/16/2019   Plan:  Orena Cavazos Runkles was discharged to home in good condition. Follow-up appointment at [facility] in 6 weeks.  Discharge Instructions: Per After Visit Summary. Refer to After Visit Summary and [facility] discharge booklet  Activity: Advance as tolerated. Pelvic rest for 6 weeks.   Diet: Regular, Heart Healthy Discharge Medications: Allergies as of 05/17/2019      Reactions   Erythromycin Nausea And Vomiting   Cefdinir Rash      Medication List    STOP taking these medications   doxylamine (Sleep) 25 MG tablet Commonly known as: UNISOM   oxyCODONE 5 MG immediate release tablet Commonly known as: Oxy IR/ROXICODONE   polyethylene glycol 17 g packet Commonly known as: MIRALAX / GLYCOLAX   potassium chloride SA 20 MEQ tablet Commonly known as:  KLOR-CON   promethazine 12.5 MG tablet Commonly known as: PHENERGAN     TAKE these medications   acetaminophen 325 MG tablet Commonly known as: TYLENOL Take 2 tablets (650 mg total) by mouth every 6 (six) hours as needed for mild pain (or temp > 100).   buPROPion 300 MG 24 hr tablet Commonly known as: WELLBUTRIN XL Take 300 mg by mouth daily.   busPIRone 10 MG tablet Commonly known as: BUSPAR Take 10 mg by mouth 2 (two) times daily.   calcium carbonate 500 MG chewable tablet Commonly known as: TUMS - dosed in mg elemental calcium Chew 1 tablet by mouth 2 (two) times daily as needed for indigestion or heartburn.   docusate sodium 100 MG capsule Commonly known as: COLACE Take 1 capsule (100 mg total) by mouth 2 (two) times daily as needed for mild constipation.   famotidine 20 MG tablet Commonly known as: PEPCID Take 1 tablet (20 mg total) by mouth 2 (two) times daily. What changed:   when to take this  reasons to take this   ibuprofen 600 MG tablet Commonly known as: ADVIL Take 1 tablet (600 mg total) by mouth every 6 (six) hours.   One-A-Day Womens Prenatal 1 28-0.8-235 MG Caps Take 1 capsule by mouth daily.      Outpatient follow up:   Signed:  Lars Pinks, MSN, CNM [facility]

## 2019-05-18 LAB — TYPE AND SCREEN
ABO/RH(D): O NEG
Antibody Screen: POSITIVE
Unit division: 0
Unit division: 0

## 2019-05-18 LAB — BPAM RBC
Blood Product Expiration Date: 202010112359
Blood Product Expiration Date: 202010182359
Unit Type and Rh: 9500
Unit Type and Rh: 9500

## 2019-12-04 ENCOUNTER — Other Ambulatory Visit: Payer: Self-pay

## 2019-12-04 ENCOUNTER — Emergency Department (HOSPITAL_BASED_OUTPATIENT_CLINIC_OR_DEPARTMENT_OTHER)
Admission: EM | Admit: 2019-12-04 | Discharge: 2019-12-04 | Disposition: A | Discharge status: Home / Self Care | Payer: BC Managed Care – PPO | Attending: Emergency Medicine | Admitting: Emergency Medicine

## 2019-12-04 ENCOUNTER — Encounter (HOSPITAL_BASED_OUTPATIENT_CLINIC_OR_DEPARTMENT_OTHER): Payer: Self-pay | Admitting: *Deleted

## 2019-12-04 DIAGNOSIS — R112 Nausea with vomiting, unspecified: Secondary | ICD-10-CM | POA: Diagnosis not present

## 2019-12-04 DIAGNOSIS — R197 Diarrhea, unspecified: Secondary | ICD-10-CM | POA: Diagnosis not present

## 2019-12-04 DIAGNOSIS — Z881 Allergy status to other antibiotic agents status: Secondary | ICD-10-CM | POA: Insufficient documentation

## 2019-12-04 DIAGNOSIS — Z79899 Other long term (current) drug therapy: Secondary | ICD-10-CM | POA: Insufficient documentation

## 2019-12-04 LAB — URINALYSIS, ROUTINE W REFLEX MICROSCOPIC
Bilirubin Urine: NEGATIVE
Glucose, UA: NEGATIVE mg/dL
Hgb urine dipstick: NEGATIVE
Ketones, ur: >80 mg/dL — AB
Leukocytes,Ua: NEGATIVE
Nitrite: NEGATIVE
Protein, ur: NEGATIVE mg/dL
Specific Gravity, Urine: >1.03 — ABNORMAL HIGH (ref 1.005–1.030)
pH: 6 (ref 5.0–8.0)

## 2019-12-04 LAB — COMPREHENSIVE METABOLIC PANEL
ALT: 17 U/L (ref 0–44)
AST: 17 U/L (ref 15–41)
Albumin: 4.6 g/dL (ref 3.5–5.0)
Alkaline Phosphatase: 88 U/L (ref 38–126)
Anion gap: 12 (ref 5–15)
BUN: 21 mg/dL — ABNORMAL HIGH (ref 6–20)
CO2: 21 mmol/L — ABNORMAL LOW (ref 22–32)
Calcium: 9.1 mg/dL (ref 8.9–10.3)
Chloride: 106 mmol/L (ref 98–111)
Creatinine, Ser: 0.88 mg/dL (ref 0.44–1.00)
GFR calc Af Amer: >60 mL/min (ref >60)
GFR calc non Af Amer: >60 mL/min (ref >60)
Glucose, Bld: 109 mg/dL — ABNORMAL HIGH (ref 70–99)
Potassium: 3.7 mmol/L (ref 3.5–5.1)
Sodium: 139 mmol/L (ref 135–145)
Total Bilirubin: 1.3 mg/dL — ABNORMAL HIGH (ref 0.3–1.2)
Total Protein: 7.6 g/dL (ref 6.5–8.1)

## 2019-12-04 LAB — CBC
HCT: 45.5 % (ref 36.0–46.0)
Hemoglobin: 15.8 g/dL — ABNORMAL HIGH (ref 12.0–15.0)
MCH: 33.7 pg (ref 26.0–34.0)
MCHC: 34.7 g/dL (ref 30.0–36.0)
MCV: 97 fL (ref 80.0–100.0)
Platelets: 188 10*3/uL (ref 150–400)
RBC: 4.69 MIL/uL (ref 3.87–5.11)
RDW: 12.3 % (ref 11.5–15.5)
WBC: 9.7 10*3/uL (ref 4.0–10.5)
nRBC: 0 % (ref 0.0–0.2)

## 2019-12-04 LAB — LIPASE, BLOOD: Lipase: 25 U/L (ref 11–51)

## 2019-12-04 LAB — PREGNANCY, URINE: Preg Test, Ur: NEGATIVE

## 2019-12-04 MED ORDER — SODIUM CHLORIDE 0.9 % IV BOLUS
1000.0000 mL | Freq: Once | INTRAVENOUS | Status: AC
  Administered 2019-12-04: 1000 mL via INTRAVENOUS

## 2019-12-04 MED ORDER — SODIUM CHLORIDE 0.9 % IV BOLUS: 1000.0000 mL | Freq: Once | INTRAVENOUS | Status: DC

## 2019-12-04 MED ORDER — SODIUM CHLORIDE 0.9% FLUSH
3.0000 mL | Freq: Once | INTRAVENOUS | Status: DC
  Filled 2019-12-04: qty 3

## 2019-12-04 MED ORDER — ONDANSETRON HCL 4 MG/2ML IJ SOLN
4.0000 mg | Freq: Once | INTRAMUSCULAR | Status: AC
  Administered 2019-12-04: 4 mg via INTRAVENOUS
  Filled 2019-12-04: qty 2

## 2019-12-04 MED ORDER — ONDANSETRON 4 MG PO TBDP: 4.0000 mg | ORAL_TABLET | Freq: Three times a day (TID) | ORAL | 0 refills | Status: DC | PRN

## 2019-12-04 NOTE — ED Notes (Signed)
Pt. Reports her baby is 6 mths and has had a virus from day care same vomiting and diarrhea.  Parents from Day Care with small babies have also had same virus.

## 2019-12-04 NOTE — ED Triage Notes (Signed)
Pt reports N/V/D that started today. pts child was sick with same yesterday. Denies fever, denies pain.

## 2019-12-04 NOTE — ED Provider Notes (Signed)
[facility] EMERGENCY DEPARTMENT Provider Note   CSN: [id number] Arrival date & time: 12/04/19  1653    History Chief Complaint  Patient presents with  . Emesis    Taylor Luna is a 39 y.o. female with past medical history significant for recent pregnancy, November 2020 currently lactating who presents for evaluation of emesis and diarrhea.  Patient states her infant is currently in daycare and multiple children have had similar symptoms.  Her infant did come home with emesis and diarrhea which hers has self resolved.  Patient symptoms began this morning.  Patient with approximately 10 episodes of NBNB emesis. No associated abdominal pain.  No fever, chills, chest pain, shortness of breath, dysuria.  Multiple episodes of non-melanotic nonbloody stool.  Feels like her milk supply has been less and is concerned about dehydration.  Has not taken anything for symptoms.  No recent antibiotics or recent travel.  Denies additional aggravating or alleviating factors.  History obtained from patient and past medical records.  No interpreter is used.  HPI     Past Medical History:  Diagnosis Date  . Adnexal mass   . Carcinoma in situ of cervix uteri   . Cholecystitis 09/2018  . Depression with anxiety   . Dizziness   . Hx of condyloma acuminatum   . Pregnancy 2008   second pregnancy Jan 2020  . Vaginal Pap smear, abnormal     Patient Active Problem List   Diagnosis Date Noted  . Postpartum care following VAVB(9/29) 05/16/2019  . Vacuum-assisted vaginal delivery 05/16/2019  . Anxiety and depression 05/16/2019  . Rh negative, maternal 05/16/2019  . Encounter for induction of labor 05/15/2019    Past Surgical History:  Procedure Laterality Date  . broken nose  2008   surgery to repair  . CHOLECYSTECTOMY N/A 10/11/2018   Procedure: LAPAROSCOPIC CHOLECYSTECTOMY;  Surgeon: Romie Levee, MD;  Location: [facility];  Service: General;  Laterality: N/A;  . LEEP       OB  History    Gravida  2   Para  2   Term  2   Preterm      AB      Living  2     SAB      TAB      Ectopic      Multiple  0   Live Births  2           Family History  Problem Relation Age of Onset  . Lupus Mother   . Hypertension Father   . Diabetes Maternal Grandmother   . Cancer Maternal Grandmother   . Cancer Maternal Grandfather   . Hypertension Paternal Grandmother     Social History   Tobacco Use  . Smoking status: Never Smoker  . Smokeless tobacco: Never Used  Substance Use Topics  . Alcohol use: No  . Drug use: No    Home Medications Prior to Admission medications   Medication Sig Start Date End Date Taking? Authorizing Provider  acetaminophen (TYLENOL) 325 MG tablet Take 2 tablets (650 mg total) by mouth every 6 (six) hours as needed for mild pain (or temp > 100). 10/12/18   Maczis, Elmer Sow, PA-C  buPROPion (WELLBUTRIN XL) 300 MG 24 hr tablet Take 300 mg by mouth daily.    [provider]  busPIRone (BUSPAR) 10 MG tablet Take 10 mg by mouth 2 (two) times daily.    [provider]  calcium carbonate (TUMS - DOSED IN MG  ELEMENTAL CALCIUM) 500 MG chewable tablet Chew 1 tablet by mouth 2 (two) times daily as needed for indigestion or heartburn.    [provider]  docusate sodium (COLACE) 100 MG capsule Take 1 capsule (100 mg total) by mouth 2 (two) times daily as needed for mild constipation. 10/12/18   Maczis, Elmer Sow, PA-C  famotidine (PEPCID) 20 MG tablet Take 1 tablet (20 mg total) by mouth 2 (two) times daily. Patient taking differently: Take 20 mg by mouth daily as needed for heartburn or indigestion.  10/10/18   Palumbo, April, MD  ibuprofen (ADVIL) 600 MG tablet Take 1 tablet (600 mg total) by mouth every 6 (six) hours. 05/17/19   Sigmon, Scarlette Slice, CNM  ondansetron (ZOFRAN ODT) 4 MG disintegrating tablet Take 1 tablet (4 mg total) by mouth every 8 (eight) hours as needed for nausea or vomiting. 12/04/19   Domitila Stetler,  Lalana Wachter A, PA-C  Prenat-Fe Carbonyl-FA-Omega 3 (ONE-A-DAY WOMENS PRENATAL 1) 28-0.8-235 MG CAPS Take 1 capsule by mouth daily.    [provider]    Allergies    Erythromycin and Cefdinir  Review of Systems   Review of Systems  Constitutional: Negative.   HENT: Negative.   Respiratory: Negative.   Cardiovascular: Negative.   Gastrointestinal: Positive for diarrhea, nausea and vomiting. Negative for abdominal distention, abdominal pain, anal bleeding, blood in stool, constipation and rectal pain.  Genitourinary: Negative.   Musculoskeletal: Negative.   Skin: Negative.   Neurological: Negative.   All other systems reviewed and are negative.   Physical Exam Updated Vital Signs BP 97/68 (BP Location: Right Arm)   Pulse 82   Temp 98.3 F (36.8 C) (Oral)   Resp 17   Ht 5\' 3"  (1.6 m)   Wt 49.9 kg   SpO2 98%   BMI 19.49 kg/m   Physical Exam Vitals and nursing note reviewed.  Constitutional:      General: She is not in acute distress.    Appearance: She is well-developed. She is not ill-appearing, toxic-appearing or diaphoretic.  HENT:     Head: Normocephalic and atraumatic.     Nose: Nose normal.     Mouth/Throat:     Mouth: Mucous membranes are moist.     Pharynx: Oropharynx is clear.  Eyes:     Pupils: Pupils are equal, round, and reactive to light.  Cardiovascular:     Rate and Rhythm: Normal rate.     Pulses: Normal pulses.     Heart sounds: Normal heart sounds.  Pulmonary:     Effort: Pulmonary effort is normal. No respiratory distress.     Breath sounds: Normal breath sounds.  Abdominal:     General: Bowel sounds are normal. There is no distension.     Comments: Soft, nontender.  No rebound or guarding.  Normoactive bowel sounds  Musculoskeletal:        General: Normal range of motion.     Cervical back: Normal range of motion.     Comments: Moves all 4 extremities without difficulty  Skin:    General: Skin is warm and dry.     Capillary Refill:  Capillary refill takes less than 2 seconds.     Comments: Brisk capillary refill  Neurological:     General: No focal deficit present.     Mental Status: She is alert and oriented to person, place, and time.    ED Results / Procedures / Treatments   Labs (all labs ordered are listed, but only abnormal results  are displayed) Labs Reviewed  COMPREHENSIVE METABOLIC PANEL - Abnormal; Notable for the following components:      Result Value   CO2 21 (*)    Glucose, Bld 109 (*)    BUN 21 (*)    Total Bilirubin 1.3 (*)    All other components within normal limits  CBC - Abnormal; Notable for the following components:   Hemoglobin 15.8 (*)    All other components within normal limits  URINALYSIS, ROUTINE W REFLEX MICROSCOPIC - Abnormal; Notable for the following components:   Specific Gravity, Urine >1.030 (*)    Ketones, ur >80 (*)    All other components within normal limits  LIPASE, BLOOD  PREGNANCY, URINE    EKG None  Radiology No results found.  Procedures Procedures (including critical care time)  Medications Ordered in ED Medications  sodium chloride flush (NS) 0.9 % injection 3 mL (3 mLs Intravenous Not Given 12/04/19 1810)  sodium chloride 0.9 % bolus 1,000 mL (0 mLs Intravenous Stopped 12/04/19 2000)  ondansetron (ZOFRAN) injection 4 mg (4 mg Intravenous Given 12/04/19 1902)  ondansetron (ZOFRAN) injection 4 mg (4 mg Intravenous Given 12/04/19 2107)    ED Course  I have reviewed the triage vital signs and the nursing notes.  Pertinent labs & imaging results that were available during my care of the patient were reviewed by me and considered in my medical decision making (see chart for details).  89 female appears otherwise well presents for evaluation of emesis and diarrhea.  She is afebrile, nonseptic, not ill-appearing.  Similar symptoms in child and patient's daycare.  No recent antibiotic use or travel.  No associated nominal pain.  She looks clinically  well-hydrated.  Multiple episodes of NBNB emesis as well as nonmelanotic nonbloody stool.  Is currently breast-feeding and is concerned for low output due to dehydration.  We will plan on fluids, antiemetics.  Her heart and lungs are clear.  Her abdomen is soft, nontender.  Nonsurgical abdomen.  Labs obtained from triage personally reviewed and interpreted: CBC without leukocytosis Metabolic panel with mild hyperglycemia at 109, T bili 1.3 however no additional LFT elevation.  Negative Murphy sign.  Low suspicion for cholecystitis, choledocholithiasis Lipase 25  2015: Patient reassessed.  Nausea significantly improved however patient still feels slightly nauseous and feels like she would vomit if she tried p.o. challenge.  Will give additional dose of Zofran.  Repeat abdominal exam benign without evidence of surgical abdomen  2155: Patient reassessed. Nausea with improvement. Tolerating liquids. UA sent. Abd soft, non tender. No surgical abdomen.  2220: UA negative for infection however does show dehydration. Urine preg negative. She is tolerating p.o. intake without difficulty. Will dc home with Zofran and strict return precautions.  Patient is nontoxic, nonseptic appearing, in no apparent distress.  Patient's pain and other symptoms adequately managed in emergency department.  Fluid bolus given.  Labs, imaging and vitals reviewed.  Patient does not meet the SIRS or Sepsis criteria.  On repeat exam patient does not have a surgical abdomin and there are no peritoneal signs.  No indication of appendicitis, bowel obstruction, bowel perforation, cholecystitis, diverticulitis, PID or ectopic pregnancy.  Patient discharged home with symptomatic treatment and given strict instructions for follow-up with their primary care physician.  I have also discussed reasons to return immediately to the ER.  Patient expresses understanding and agrees with plan.    MDM Rules/Calculators/A&P  Final Clinical Impression(s) / ED Diagnoses Final diagnoses:  Nausea vomiting and diarrhea    Rx / DC Orders ED Discharge Orders         Ordered    ondansetron (ZOFRAN ODT) 4 MG disintegrating tablet  Every 8 hours PRN     12/04/19 2222           Matthias Bogus A, PA-C 12/04/19 2223    Little, Wenda Overland, MD 12/04/19 [phone]

## 2019-12-04 NOTE — ED Notes (Signed)
Pharmacy and medications updated with patient 

## 2019-12-04 NOTE — Discharge Instructions (Signed)
Take the medications as prescribed.  If you are still having vomiting despite the medication please seek reevaluation.  If you develop severe abdominal pain please return for reevaluation as well

## 2019-12-04 NOTE — ED Notes (Signed)
Pt. Tells RN on arrival unable to urinate and still unable

## 2023-07-28 LAB — HEPATITIS C ANTIBODY: HCV Ab: NEGATIVE

## 2023-07-28 LAB — OB RESULTS CONSOLE HEPATITIS B SURFACE ANTIGEN: Hepatitis B Surface Ag: NEGATIVE

## 2023-07-28 LAB — OB RESULTS CONSOLE ANTIBODY SCREEN: Antibody Screen: NEGATIVE

## 2023-07-28 LAB — OB RESULTS CONSOLE RPR: RPR: NONREACTIVE

## 2023-07-28 LAB — OB RESULTS CONSOLE RUBELLA ANTIBODY, IGM: Rubella: IMMUNE

## 2023-07-28 LAB — OB RESULTS CONSOLE HIV ANTIBODY (ROUTINE TESTING): HIV: NONREACTIVE

## 2023-08-17 NOTE — L&D Delivery Note (Signed)
   Delivery Note:   [id number] at [redacted]w[redacted]d  Admitting diagnosis: Encounter for induction of labor [Z34.90] Risks: AMA; Rh negative; bilateral kidney stones; DFM at term Onset of labor: 02/06/2024 at 1400 IOL/Augmentation: AROM and Pitocin  ROM: 02/06/2024 at 1400, clear fluid  Complete dilation at 02/06/2024 1730 Onset of pushing at 1740 FHR second stage Cat II, variables with pushing  Analgesia/Anesthesia intrapartum:Epidural Pushing in lithotomy position with CNM and L&D staff support. Husband, [name], present for birth and supportive.  Pushing with good maternal effort, FHT with variables with pushing, after approximately 40 minutes of pushing, FHT in the 70s with slow return to baseline. Dr. Linnell called for vacuum evaluation. See note from Dr. Linnell.   Delivery of a Live born female  Birth Weight:  pending APGAR: 7, 9   Newborn Delivery   Birth date/time: 02/06/2024 18:26:00 Delivery type: Vaginal, Vacuum (Extractor)    APGAR:1 min-7 , 5 min-9   Nuchal Cord: Yes , clamped and cut on perineum by CNM Collection of cord blood for typing completed. Arterial cord blood sample-No   Placenta delivered-Spontaneous with 3 vessels by CNM Uterotonics: Pitocin , methergine  Placenta to L&D Uterine tone firm  Bleeding scant with fundal massage, methergine  given  None laceration identified.  Episiotomy:None Local analgesia: N/A  Repair: small <1cm skin tag removed from right labia with patient consent, 1 suture placed for hemostasis Est. Blood Loss (mL):172.00  Complications: None  Mom to postpartum. Baby Johannah to Couplet care / Skin to Skin.  Delivery Report:   Review the Delivery Report for details.    Alan MARLA Molt, CNM, MSN 02/06/2024, 6:56 PM

## 2023-08-17 NOTE — L&D Delivery Note (Signed)
 Operative Delivery Note At 1826 a viable and healthy female was delivered via vavd.  Presentation: vertex; Position: Left,, Occiput,, Anterior; Station: +2. The mini-kiwi was applied d/t fetal bradycardia in 70s (asked by CNM for vacuum assist). The vacuum was set to green and then pt pushed - contraction occurred just after application of device and vacuum was used to assist with 3 pushes. Fetal head then close to delivery and pt pushed again with onset of delivery of fetal head and vacuum was removed and head delivered. Tight nuchal noted and cut before delivery of shoulders. There then seemed to be a decrease of maternal effort - posterior shoulder free and delivering but had suprapubic pressure to assist in delivery. Fetus then delivered w/o further issue.  Verbal consent: obtained from patient.  APGAR: 7,9 ; weight  not yet done   Placenta status: delivered,spontaneous, intact.   Cord:  3vv, .  Anesthesia:  epidural Episiotomy:  none Lacerations:  none Est. Blood Loss (mL):   Mom to postpartum.  Baby to Couplet care / Skin to Skin.  Taylor Luna 02/06/2024, 7:26 PM

## 2023-08-25 LAB — OB RESULTS CONSOLE GC/CHLAMYDIA
Chlamydia: NEGATIVE
Neisseria Gonorrhea: NEGATIVE

## 2023-12-04 ENCOUNTER — Encounter (HOSPITAL_COMMUNITY): Payer: Self-pay | Admitting: *Deleted

## 2023-12-04 ENCOUNTER — Inpatient Hospital Stay (HOSPITAL_COMMUNITY)
Admission: AD | Admit: 2023-12-04 | Discharge: 2023-12-04 | Disposition: A | Discharge status: Home / Self Care | Attending: Obstetrics & Gynecology | Admitting: Obstetrics & Gynecology

## 2023-12-04 DIAGNOSIS — O98513 Other viral diseases complicating pregnancy, third trimester: Secondary | ICD-10-CM | POA: Insufficient documentation

## 2023-12-04 DIAGNOSIS — Z3A29 29 weeks gestation of pregnancy: Secondary | ICD-10-CM | POA: Diagnosis not present

## 2023-12-04 DIAGNOSIS — U071 COVID-19: Secondary | ICD-10-CM | POA: Diagnosis not present

## 2023-12-04 DIAGNOSIS — R519 Headache, unspecified: Secondary | ICD-10-CM | POA: Diagnosis present

## 2023-12-04 LAB — URINALYSIS, ROUTINE W REFLEX MICROSCOPIC
Bilirubin Urine: NEGATIVE
Glucose, UA: NEGATIVE mg/dL
Hgb urine dipstick: NEGATIVE
Ketones, ur: 20 mg/dL — AB
Leukocytes,Ua: NEGATIVE
Nitrite: NEGATIVE
Protein, ur: 30 mg/dL — AB
Specific Gravity, Urine: 1.021 (ref 1.005–1.030)
pH: 7 (ref 5.0–8.0)

## 2023-12-04 LAB — RESP PANEL BY RT-PCR (RSV, FLU A&B, COVID)  RVPGX2
Influenza A by PCR: NEGATIVE
Influenza B by PCR: NEGATIVE
Resp Syncytial Virus by PCR: NEGATIVE
SARS Coronavirus 2 by RT PCR: POSITIVE — AB

## 2023-12-04 MED ORDER — CYCLOBENZAPRINE HCL 5 MG PO TABS
10.0000 mg | ORAL_TABLET | Freq: Once | ORAL | Status: AC
  Administered 2023-12-04: 10 mg via ORAL
  Filled 2023-12-04: qty 2

## 2023-12-04 NOTE — MAU Provider Note (Signed)
 History     CSN: [id number]  Arrival date and time: 12/04/23 1646   Event Date/Time   First Provider Initiated Contact with Patient 12/04/23 1825      Chief Complaint  Patient presents with   Headache   Cough   HPI Ms. Taylor Luna is a 43 y.o. year old [id number] female at [redacted]w[redacted]d weeks gestation who presents to MAU reporting not feeling good for the past 24 hours. She reports congestion and coughing since last night. She reports an "achy" feeling on her RT side. She has had a H/A for the past 24-48 hours; pain rated 5/10. She has been taking Tylenol  for the H/A with relief, but not the last time she took Tylenol . Her last dose of Tylenol  was @ 1500. She states the Tylenol  wore off about 2-2.5 hours later. She has not been around anyone sick, but she states " I have a 41 yo and a 41 yo, Os I'm not sure where I could have gotten sick from. I wouldn't normally worry about the mild symptoms I'm having if I weren't pregnant." She receives [facility] with [facility] (Dr. Harless Lien). Prenatal records reviewed. Her spouse is present and contributing to the history taking.   OB History     Gravida  3   Para  2   Term  2   Preterm      AB      Living  2      SAB      IAB      Ectopic      Multiple  0   Live Births  2           Past Medical History:  Diagnosis Date   Adnexal mass    Carcinoma in situ of cervix uteri    Cholecystitis 09/2018   Depression with anxiety    Dizziness    Hx of condyloma acuminatum    Pregnancy 2008   second pregnancy Jan 2020   Vaginal Pap smear, abnormal     Past Surgical History:  Procedure Laterality Date   broken nose  2008   surgery to repair   CHOLECYSTECTOMY N/A 10/11/2018   Procedure: LAPAROSCOPIC CHOLECYSTECTOMY;  Surgeon: Joyce Nixon, MD;  Location: [facility];  Service: General;  Laterality: N/A;   LEEP      Family History  Problem Relation Age of Onset   Lupus Mother    Hypertension Father    Diabetes Maternal  Grandmother    Cancer Maternal Grandmother    Cancer Maternal Grandfather    Hypertension Paternal Grandmother     Social History   Tobacco Use   Smoking status: Never   Smokeless tobacco: Never  Vaping Use   Vaping status: Never Used  Substance Use Topics   Alcohol use: No   Drug use: No    Allergies:  Allergies  Allergen Reactions   Erythromycin Nausea And Vomiting   Cefdinir Rash    Medications Prior to Admission  Medication Sig Dispense Refill Last Dose/Taking   acetaminophen  (TYLENOL ) 325 MG tablet Take 2 tablets (650 mg total) by mouth every 6 (six) hours as needed for mild pain (or temp > 100). 30 tablet 0 12/04/2023 at  3:00 PM   aspirin EC 81 MG tablet Take 81 mg by mouth daily. Swallow whole.   12/04/2023   Prenat-Fe Carbonyl-FA-Omega 3 (ONE-A-DAY WOMENS PRENATAL 1) 28-0.8-235 MG CAPS Take 1 capsule by mouth daily.   12/03/2023   buPROPion  (WELLBUTRIN  XL) 300  MG 24 hr tablet Take 300 mg by mouth daily.      busPIRone  (BUSPAR ) 10 MG tablet Take 10 mg by mouth 2 (two) times daily.      calcium  carbonate (TUMS - DOSED IN MG ELEMENTAL CALCIUM ) 500 MG chewable tablet Chew 1 tablet by mouth 2 (two) times daily as needed for indigestion or heartburn.      docusate sodium  (COLACE) 100 MG capsule Take 1 capsule (100 mg total) by mouth 2 (two) times daily as needed for mild constipation. 10 capsule 0    famotidine  (PEPCID ) 20 MG tablet Take 1 tablet (20 mg total) by mouth 2 (two) times daily. (Patient taking differently: Take 20 mg by mouth daily as needed for heartburn or indigestion. ) 30 tablet 0    ibuprofen  (ADVIL ) 600 MG tablet Take 1 tablet (600 mg total) by mouth every 6 (six) hours. 30 tablet 0    ondansetron  (ZOFRAN  ODT) 4 MG disintegrating tablet Take 1 tablet (4 mg total) by mouth every 8 (eight) hours as needed for nausea or vomiting. 20 tablet 0     Review of Systems  Constitutional: Negative.   HENT:  Positive for congestion.   Eyes: Negative.   Respiratory:   Positive for cough.   Cardiovascular: Negative.   Gastrointestinal:  Positive for nausea (mild). Negative for vomiting (last night, "maybe d/t coughing").  Endocrine: Negative.   Genitourinary: Negative.   Musculoskeletal: Negative.   Skin: Negative.   Allergic/Immunologic: Negative.   Neurological: Negative.   Hematological: Negative.   Psychiatric/Behavioral: Negative.     Physical Exam   Blood pressure 94/63, pulse (!) 125, temperature 98.6 F (37 C), temperature source Oral, resp. rate 17, height 5\' 3"  (1.6 m), weight 55.8 kg, SpO2 100%, unknown if currently breastfeeding.  Physical Exam Vitals and nursing note reviewed.  Constitutional:      Appearance: Normal appearance. She is normal weight.  Cardiovascular:     Rate and Rhythm: Tachycardia present.  Pulmonary:     Effort: Pulmonary effort is normal.  Genitourinary:    Comments: Not indicated Musculoskeletal:        General: Normal range of motion.  Skin:    General: Skin is warm and dry.  Neurological:     Mental Status: She is alert and oriented to person, place, and time.  Psychiatric:        Mood and Affect: Mood normal.        Behavior: Behavior normal.        Thought Content: Thought content normal.        Judgment: Judgment normal.    REACTIVE NST - FHR: 135 bpm / moderate variability / accels present / decels absent / TOCO: none MAU Course  Procedures  MDM CCUA COVID/Flu/RSV swab Flexeril  10 mg po -- no change in H/A  Results for orders placed or performed during the hospital encounter of 12/04/23 (from the past 24 hours)  Urinalysis, Routine w reflex microscopic -Urine, Clean Catch     Status: Abnormal   Collection Time: 12/04/23  5:04 PM  Result Value Ref Range   Color, Urine YELLOW YELLOW   APPearance CLOUDY (A) CLEAR   Specific Gravity, Urine 1.021 1.005 - 1.030   pH 7.0 5.0 - 8.0   Glucose, UA NEGATIVE NEGATIVE mg/dL   Hgb urine dipstick NEGATIVE NEGATIVE   Bilirubin Urine NEGATIVE  NEGATIVE   Ketones, ur 20 (A) NEGATIVE mg/dL   Protein, ur 30 (A) NEGATIVE mg/dL   Nitrite NEGATIVE NEGATIVE  Leukocytes,Ua NEGATIVE NEGATIVE   RBC / HPF 0-5 0 - 5 RBC/hpf   WBC, UA 0-5 0 - 5 WBC/hpf   Bacteria, UA RARE (A) NONE SEEN   Squamous Epithelial / HPF 6-10 0 - 5 /HPF   Mucus PRESENT   Resp panel by RT-PCR (RSV, Flu A&B, Covid) Anterior Nasal Swab     Status: Abnormal   Collection Time: 12/04/23  6:53 PM   Specimen: Anterior Nasal Swab  Result Value Ref Range   SARS Coronavirus 2 by RT PCR POSITIVE (A) NEGATIVE   Influenza A by PCR NEGATIVE NEGATIVE   Influenza B by PCR NEGATIVE NEGATIVE   Resp Syncytial Virus by PCR NEGATIVE NEGATIVE    Assessment and Plan  1. COVID-19 affecting pregnancy in third trimester (Primary) - Discussed risks vs benefits of taking Paxlovid or not - Patient and spouse opt to NOT be prescribed Paxlovid - Offered safe meds list; patient has one at home already - Information provided on COVID and pregnancy and quarantine vs isolation - Note to be OOW until Thursday 12/08/2023.  - Advised to wear a mask when she returns to work.   2. [redacted] weeks gestation of pregnancy   - Discharge home  - Keep scheduled appt with [facility] on 12/14/2023 - Patient verbalized an understanding of the plan of care and agrees.   Almond Army, CNM 12/04/2023, 6:25 PM

## 2023-12-04 NOTE — Discharge Instructions (Addendum)
 Stay well-hydrated and rest while you are out of work. Wear a mask when you return to work.

## 2023-12-04 NOTE — MAU Note (Signed)
.  Taylor Luna is a 43 y.o. at [redacted]w[redacted]d here in MAU reporting: for the last 24 hours she just has not felt good. Congested cough since last pm, tightness in her chest with she takes a deep breath. Achy feeling on right side under her ribs. Headaches for the past 24-48 hours. Reports good fetal movement.denies vaginal bleeding or ROM. Denies fever  Onset of complaint: 48 hours ago Pain score: 2/10 right side and hip area There were no vitals filed for this visit.   FHT: 144  Lab orders placed from triage: urine

## 2023-12-10 ENCOUNTER — Other Ambulatory Visit: Payer: Self-pay

## 2023-12-10 ENCOUNTER — Inpatient Hospital Stay (HOSPITAL_COMMUNITY)

## 2023-12-10 ENCOUNTER — Observation Stay (HOSPITAL_COMMUNITY)
Admission: AD | Admit: 2023-12-10 | Discharge: 2023-12-11 | Disposition: A | Discharge status: Home / Self Care | Attending: Obstetrics | Admitting: Obstetrics

## 2023-12-10 ENCOUNTER — Encounter (HOSPITAL_COMMUNITY): Payer: Self-pay | Admitting: Obstetrics

## 2023-12-10 DIAGNOSIS — Z86001 Personal history of in-situ neoplasm of cervix uteri: Secondary | ICD-10-CM | POA: Diagnosis not present

## 2023-12-10 DIAGNOSIS — R109 Unspecified abdominal pain: Secondary | ICD-10-CM | POA: Insufficient documentation

## 2023-12-10 DIAGNOSIS — O26833 Pregnancy related renal disease, third trimester: Principal | ICD-10-CM | POA: Insufficient documentation

## 2023-12-10 DIAGNOSIS — Z79899 Other long term (current) drug therapy: Secondary | ICD-10-CM | POA: Insufficient documentation

## 2023-12-10 DIAGNOSIS — Z3A29 29 weeks gestation of pregnancy: Secondary | ICD-10-CM | POA: Insufficient documentation

## 2023-12-10 DIAGNOSIS — O09523 Supervision of elderly multigravida, third trimester: Secondary | ICD-10-CM | POA: Diagnosis not present

## 2023-12-10 DIAGNOSIS — Z7982 Long term (current) use of aspirin: Secondary | ICD-10-CM | POA: Insufficient documentation

## 2023-12-10 DIAGNOSIS — O99891 Other specified diseases and conditions complicating pregnancy: Secondary | ICD-10-CM | POA: Diagnosis not present

## 2023-12-10 DIAGNOSIS — N200 Calculus of kidney: Secondary | ICD-10-CM | POA: Diagnosis not present

## 2023-12-10 DIAGNOSIS — O26893 Other specified pregnancy related conditions, third trimester: Secondary | ICD-10-CM | POA: Diagnosis present

## 2023-12-10 LAB — URINALYSIS, ROUTINE W REFLEX MICROSCOPIC
Bacteria, UA: NONE SEEN
Bilirubin Urine: NEGATIVE
Glucose, UA: NEGATIVE mg/dL
Ketones, ur: 20 mg/dL — AB
Leukocytes,Ua: NEGATIVE
Nitrite: NEGATIVE
Protein, ur: NEGATIVE mg/dL
Specific Gravity, Urine: 1.012 (ref 1.005–1.030)
pH: 7 (ref 5.0–8.0)

## 2023-12-10 LAB — CBC
HCT: 31.5 % — ABNORMAL LOW (ref 36.0–46.0)
Hemoglobin: 10.9 g/dL — ABNORMAL LOW (ref 12.0–15.0)
MCH: 33.5 pg (ref 26.0–34.0)
MCHC: 34.6 g/dL (ref 30.0–36.0)
MCV: 96.9 fL (ref 80.0–100.0)
Platelets: 192 10*3/uL (ref 150–400)
RBC: 3.25 MIL/uL — ABNORMAL LOW (ref 3.87–5.11)
RDW: 12.3 % (ref 11.5–15.5)
WBC: 7 10*3/uL (ref 4.0–10.5)
nRBC: 0 % (ref 0.0–0.2)

## 2023-12-10 LAB — COMPREHENSIVE METABOLIC PANEL WITH GFR
ALT: 76 U/L — ABNORMAL HIGH (ref 0–44)
AST: 68 U/L — ABNORMAL HIGH (ref 15–41)
Albumin: 2.6 g/dL — ABNORMAL LOW (ref 3.5–5.0)
Alkaline Phosphatase: 98 U/L (ref 38–126)
Anion gap: 9 (ref 5–15)
BUN: 6 mg/dL (ref 6–20)
CO2: 18 mmol/L — ABNORMAL LOW (ref 22–32)
Calcium: 8.1 mg/dL — ABNORMAL LOW (ref 8.9–10.3)
Chloride: 109 mmol/L (ref 98–111)
Creatinine, Ser: 0.6 mg/dL (ref 0.44–1.00)
GFR, Estimated: >60 mL/min (ref >60)
Glucose, Bld: 91 mg/dL (ref 70–99)
Potassium: 3.5 mmol/L (ref 3.5–5.1)
Sodium: 136 mmol/L (ref 135–145)
Total Bilirubin: 0.9 mg/dL (ref 0.0–1.2)
Total Protein: 5.5 g/dL — ABNORMAL LOW (ref 6.5–8.1)

## 2023-12-10 LAB — LIPASE, BLOOD: Lipase: 32 U/L (ref 11–51)

## 2023-12-10 LAB — TYPE AND SCREEN
ABO/RH(D): O NEG
Antibody Screen: NEGATIVE

## 2023-12-10 MED ORDER — GABAPENTIN 300 MG PO CAPS
300.0000 mg | ORAL_CAPSULE | Freq: Three times a day (TID) | ORAL | Status: DC
  Administered 2023-12-10 – 2023-12-11 (×3): 300 mg via ORAL
  Filled 2023-12-10 (×5): qty 1

## 2023-12-10 MED ORDER — HYDROMORPHONE HCL 1 MG/ML IJ SOLN: 1.0000 mg | INTRAMUSCULAR | Status: DC | PRN

## 2023-12-10 MED ORDER — LACTATED RINGERS IV SOLN: INTRAVENOUS | Status: DC

## 2023-12-10 MED ORDER — CALCIUM CARBONATE ANTACID 500 MG PO CHEW
2.0000 | CHEWABLE_TABLET | ORAL | Status: DC | PRN
  Administered 2023-12-10: 400 mg via ORAL
  Filled 2023-12-10: qty 2

## 2023-12-10 MED ORDER — PROCHLORPERAZINE EDISYLATE 10 MG/2ML IJ SOLN
10.0000 mg | Freq: Once | INTRAMUSCULAR | Status: AC
  Administered 2023-12-10: 10 mg via INTRAVENOUS
  Filled 2023-12-10: qty 2

## 2023-12-10 MED ORDER — SODIUM CHLORIDE 0.9% FLUSH: 3.0000 mL | Freq: Two times a day (BID) | INTRAVENOUS | Status: DC

## 2023-12-10 MED ORDER — TAMSULOSIN HCL 0.4 MG PO CAPS: 0.4000 mg | ORAL_CAPSULE | Freq: Every day | ORAL | 0 refills | Status: DC

## 2023-12-10 MED ORDER — TRAMADOL HCL 50 MG PO TABS: 50.0000 mg | ORAL_TABLET | Freq: Four times a day (QID) | ORAL | 0 refills | Status: DC | PRN

## 2023-12-10 MED ORDER — ONDANSETRON HCL 4 MG/2ML IJ SOLN
4.0000 mg | Freq: Once | INTRAMUSCULAR | Status: AC
  Administered 2023-12-10: 4 mg via INTRAVENOUS
  Filled 2023-12-10: qty 2

## 2023-12-10 MED ORDER — ONDANSETRON 4 MG PO TBDP: 4.0000 mg | ORAL_TABLET | Freq: Three times a day (TID) | ORAL | 0 refills | Status: DC | PRN

## 2023-12-10 MED ORDER — PROCHLORPERAZINE EDISYLATE 10 MG/2ML IJ SOLN: 10.0000 mg | Freq: Four times a day (QID) | INTRAMUSCULAR | Status: DC | PRN

## 2023-12-10 MED ORDER — PRENATAL MULTIVITAMIN CH
1.0000 | ORAL_TABLET | Freq: Every day | ORAL | Status: DC
  Administered 2023-12-11: 1 via ORAL
  Filled 2023-12-10: qty 1

## 2023-12-10 MED ORDER — SODIUM CHLORIDE 0.9% FLUSH: 3.0000 mL | INTRAVENOUS | Status: DC | PRN

## 2023-12-10 MED ORDER — LACTATED RINGERS IV BOLUS
1000.0000 mL | Freq: Once | INTRAVENOUS | Status: AC
  Administered 2023-12-10: 1000 mL via INTRAVENOUS

## 2023-12-10 MED ORDER — FAMOTIDINE 20 MG PO TABS
20.0000 mg | ORAL_TABLET | Freq: Every day | ORAL | Status: DC
  Administered 2023-12-11: 20 mg via ORAL
  Filled 2023-12-10: qty 1

## 2023-12-10 MED ORDER — ONDANSETRON 4 MG PO TBDP: 4.0000 mg | ORAL_TABLET | Freq: Four times a day (QID) | ORAL | 0 refills | Status: DC | PRN

## 2023-12-10 MED ORDER — ONDANSETRON HCL 4 MG/2ML IJ SOLN: 4.0000 mg | Freq: Three times a day (TID) | INTRAMUSCULAR | Status: DC | PRN

## 2023-12-10 MED ORDER — HYDROMORPHONE HCL 1 MG/ML IJ SOLN
1.0000 mg | INTRAMUSCULAR | Status: DC | PRN
  Administered 2023-12-10: 1 mg via INTRAVENOUS
  Filled 2023-12-10: qty 1

## 2023-12-10 MED ORDER — TAMSULOSIN HCL 0.4 MG PO CAPS
0.4000 mg | ORAL_CAPSULE | Freq: Every day | ORAL | Status: DC
  Administered 2023-12-11: 0.4 mg via ORAL
  Filled 2023-12-10: qty 1

## 2023-12-10 MED ORDER — HYDROMORPHONE HCL 1 MG/ML IJ SOLN
1.0000 mg | Freq: Once | INTRAMUSCULAR | Status: AC
  Administered 2023-12-10: 1 mg via INTRAVENOUS
  Filled 2023-12-10: qty 1

## 2023-12-10 MED ORDER — POLYETHYLENE GLYCOL 3350 17 G PO PACK: 17.0000 g | PACK | Freq: Every day | ORAL | Status: DC | PRN

## 2023-12-10 MED ORDER — PROMETHAZINE HCL 12.5 MG PO TABS: 12.5000 mg | ORAL_TABLET | Freq: Four times a day (QID) | ORAL | 0 refills | Status: DC | PRN

## 2023-12-10 MED ORDER — ASPIRIN 81 MG PO TBEC
81.0000 mg | DELAYED_RELEASE_TABLET | Freq: Every day | ORAL | Status: DC
  Administered 2023-12-11: 81 mg via ORAL
  Filled 2023-12-10: qty 1

## 2023-12-10 MED ORDER — ACETAMINOPHEN 500 MG PO TABS
1000.0000 mg | ORAL_TABLET | Freq: Four times a day (QID) | ORAL | Status: DC
  Administered 2023-12-10 – 2023-12-11 (×4): 1000 mg via ORAL
  Filled 2023-12-10 (×4): qty 2

## 2023-12-10 MED ORDER — OXYCODONE HCL 5 MG PO TABS: 5.0000 mg | ORAL_TABLET | ORAL | Status: DC | PRN

## 2023-12-10 NOTE — H&P (Addendum)
 OB Antepartum Admission H&P  HD#1 Kidney stones  S: Patient is a 43yo G3P2002 at [redacted]w[redacted]d presenting with flank pain and difficulty urinating for last day. Patient was seen in MAU 5 days ago and diagnosed with COVID. I saw patient in office yesterday for some abdominal cramping. Evaluation at that time overall reassuring, cramping thought to be likely iso viral illness and dehydration, advised Tylenol  and increased fluid intake and patient sent home with return precautions. Called into answering service this morning reporting new pain now on left flank that radiates to abdomen. Feels like she cannot fully empty her bladder although she has the urge to void. Also with episodes of vomiting during pain. Endorses fetal movement. Denies LOF, vaginal bleeding, or contractions.   Past Medical History:  Diagnosis Date   Adnexal mass    Carcinoma in situ of cervix uteri    Cholecystitis 09/2018   Depression with anxiety    Dizziness    Hx of condyloma acuminatum    Pregnancy 2008   second pregnancy Jan 2020   Vaginal Pap smear, abnormal    Past Surgical History:  Procedure Laterality Date   broken nose  2008   surgery to repair   CHOLECYSTECTOMY N/A 10/11/2018   Procedure: LAPAROSCOPIC CHOLECYSTECTOMY;  Surgeon: Joyce Nixon, MD;  Location: [facility];  Service: General;  Laterality: N/A;   LEEP     No current facility-administered medications on file prior to encounter.   Current Outpatient Medications on File Prior to Encounter  Medication Sig Dispense Refill   acetaminophen  (TYLENOL ) 325 MG tablet Take 2 tablets (650 mg total) by mouth every 6 (six) hours as needed for mild pain (or temp > 100). 30 tablet 0   aspirin EC 81 MG tablet Take 81 mg by mouth daily. Swallow whole.     buPROPion  (WELLBUTRIN  XL) 300 MG 24 hr tablet Take 300 mg by mouth daily.     calcium  carbonate (TUMS - DOSED IN MG ELEMENTAL CALCIUM ) 500 MG chewable tablet Chew 1 tablet by mouth 2 (two) times daily as needed for  indigestion or heartburn.     docusate sodium  (COLACE) 100 MG capsule Take 1 capsule (100 mg total) by mouth 2 (two) times daily as needed for mild constipation. 10 capsule 0   Prenat-Fe Carbonyl-FA-Omega 3 (ONE-A-DAY WOMENS PRENATAL 1) 28-0.8-235 MG CAPS Take 1 capsule by mouth daily.     busPIRone  (BUSPAR ) 10 MG tablet Take 10 mg by mouth 2 (two) times daily.     famotidine  (PEPCID ) 20 MG tablet Take 1 tablet (20 mg total) by mouth 2 (two) times daily. (Patient taking differently: Take 20 mg by mouth daily as needed for heartburn or indigestion.) 30 tablet 0   Allergies  Allergen Reactions   Erythromycin Nausea And Vomiting   Cefdinir Rash     O:  Vitals:   12/10/23 1137 12/10/23 1430  BP: 109/70 102/76  Pulse: 82 95  Resp: 16 18  Temp: (!) 97.5 F (36.4 C) 97.7 F (36.5 C)  SpO2: 97% 99%   Physical Exam: GA: well appearing, NAD Chest: normal work of breathing on room air  Abd: soft, nontender, gravid  FHT: baseline 125bpm, moderate variability, + accels, - decels Toco: ctxs q2-19min  Labs:  UA: small blood and ketones H/H: 10.9/31.5 Cr: 0.6 AST/ALT: 68/76 - likely iso hemolyzed sample Rh neg  Imaging: Renal U/S: IMPRESSION: Mild-moderate left hydronephrosis with 1.1 cm lower pole calculus.    A/P:  80GN [id number] at [redacted]w[redacted]d w/recent COVID  infection now p/w flank and abdominal pain, difficulty urinating found to have 1.1cm left renal calculus. Urology consulted by MAU provider- recommend supportive care with IV fluids and pain control. If symptoms do not improve in 24h, plan to re-consult urology to discuss additional intervention. Will admit patient  to observation as requiring IV pain and anti-emetic medications.   #GU - L kidney stone - IV LR @ 125cc/h - Flomax daily  - Pain control - Reconsult urology as needed if sxs do not improve   #Neuro/pain - Tylenol  1000mg  q6h  - Gabapentin 300mg  TID - PO oxycodone  5mg  q4h PRN for moderate pain - IV dilaudid  1mg  q2h PRN  for breakthrough pain   #GI - Zofran  and compazine PRN for nausea  - Pepcid  and TUMS prn for refulx  - Miralax  PRN  #Routine OB - Regular diet - PNV  - ASA 81mg  daily - PPX; SCD's when in bed  - Rh neg, for Rhogam at next outpatient visit or if bleeding during admission  #Neonate - Daily NST   Ellsworth Haas, MD

## 2023-12-10 NOTE — MAU Note (Addendum)
..  Taylor Luna is a 43 y.o. at [redacted]w[redacted]d here in MAU reporting: started having urinary frequency and braxton hicks contractions yesterday and went to her OB. They told her everything was fine but to keep any eye on it and return if it got worse. Last night she started having severe left flank pain and felt like she could not empty her bladder. Denies VB or LOF. +FM.   Pain score: 10 Vitals:   12/10/23 0823  BP: 118/84  Pulse: 86  Resp: 18  Temp: 97.6 F (36.4 C)  SpO2: 100%     FHT:120 Lab orders placed from triage:   UA

## 2023-12-10 NOTE — MAU Provider Note (Addendum)
 Chief Complaint:  Flank Pain, Emesis, and Nausea   None     HPI: Taylor Luna is a 43 y.o. G3P2002 at [redacted]w[redacted]d by LMP who presents to maternity admissions reporting left flank pain that radiates to her abdomen. The pain is associated with nausea and vomiting. She reports that it started around 0600 this morning and progressively worsened. She reports good fetal movement, denies LOF, vaginal bleeding, vaginal itching/burning, urinary symptoms, h/a, dizziness, or fever/chills.  She also thinks she may be feeling some occasional contractions.   Location: left flank pain Quality: severe Severity: 10/10 on pain scale Duration: constant Associated signs and symptoms: nausea and vomiting  HPI  Past Medical History: Past Medical History:  Diagnosis Date   Adnexal mass    Carcinoma in situ of cervix uteri    Cholecystitis 09/2018   Depression with anxiety    Dizziness    Hx of condyloma acuminatum    Pregnancy 2008   second pregnancy Jan 2020   Vaginal Pap smear, abnormal     Past obstetric history: OB History  Gravida Para Term Preterm AB Living  3 2 2   2   SAB IAB Ectopic Multiple Live Births     0 2    # Outcome Date GA Lbr Len/2nd Weight Sex Type Anes PTL Lv  3 Current           2 Term 05/15/19 [redacted]w[redacted]d 09:44 / 01:31 2890 g F Vag-Vacuum EPI  LIV  1 Term 03/2007   3118 g F Vag-Vacuum EPI N LIV    Past Surgical History: Past Surgical History:  Procedure Laterality Date   broken nose  2008   surgery to repair   CHOLECYSTECTOMY N/A 10/11/2018   Procedure: LAPAROSCOPIC CHOLECYSTECTOMY;  Surgeon: Joyce Nixon, MD;  Location: [facility];  Service: General;  Laterality: N/A;   LEEP      Family History: Family History  Problem Relation Age of Onset   Lupus Mother    Hypertension Father    Diabetes Maternal Grandmother    Cancer Maternal Grandmother    Cancer Maternal Grandfather    Hypertension Paternal Grandmother     Social History: Social History   Tobacco Use    Smoking status: Never   Smokeless tobacco: Never  Vaping Use   Vaping status: Never Used  Substance Use Topics   Alcohol use: No   Drug use: No    Allergies:  Allergies  Allergen Reactions   Erythromycin Nausea And Vomiting   Cefdinir Rash    Meds:  Medications Prior to Admission  Medication Sig Dispense Refill Last Dose/Taking   acetaminophen  (TYLENOL ) 325 MG tablet Take 2 tablets (650 mg total) by mouth every 6 (six) hours as needed for mild pain (or temp > 100). 30 tablet 0 12/09/2023 at  9:00 PM   aspirin EC 81 MG tablet Take 81 mg by mouth daily. Swallow whole.   12/09/2023   calcium  carbonate (TUMS - DOSED IN MG ELEMENTAL CALCIUM ) 500 MG chewable tablet Chew 1 tablet by mouth 2 (two) times daily as needed for indigestion or heartburn.   Past Week   docusate sodium  (COLACE) 100 MG capsule Take 1 capsule (100 mg total) by mouth 2 (two) times daily as needed for mild constipation. 10 capsule 0 Past Month   Prenat-Fe Carbonyl-FA-Omega 3 (ONE-A-DAY WOMENS PRENATAL 1) 28-0.8-235 MG CAPS Take 1 capsule by mouth daily.   12/09/2023   [DISCONTINUED] ondansetron  (ZOFRAN  ODT) 4 MG disintegrating tablet Take 1 tablet (4  mg total) by mouth every 8 (eight) hours as needed for nausea or vomiting. 20 tablet 0 12/09/2023   buPROPion  (WELLBUTRIN  XL) 300 MG 24 hr tablet Take 300 mg by mouth daily.      busPIRone  (BUSPAR ) 10 MG tablet Take 10 mg by mouth 2 (two) times daily.      famotidine  (PEPCID ) 20 MG tablet Take 1 tablet (20 mg total) by mouth 2 (two) times daily. (Patient taking differently: Take 20 mg by mouth daily as needed for heartburn or indigestion. ) 30 tablet 0     ROS:  Pertinent items noted in HPI and remainder of comprehensive ROS otherwise negative.  I have reviewed patient's Past Medical Hx, Surgical Hx, Family Hx, Social Hx, medications and allergies.   Physical Exam  Patient Vitals for the past 24 hrs:  BP Temp Temp src Pulse Resp SpO2  12/10/23 1137 109/70 (!) (P) 97.5 F  (36.4 C) (P) Oral 82 (P) 16 (P) 97 %  12/10/23 1050 -- -- -- -- 18 98 %  12/10/23 1045 -- -- -- -- -- 98 %  12/10/23 1040 -- -- -- -- -- 99 %  12/10/23 1020 -- -- -- -- -- 100 %  12/10/23 1015 -- -- -- -- -- 100 %  12/10/23 1010 -- -- -- -- 20 --  12/10/23 0930 -- -- -- -- 18 --  12/10/23 0910 -- -- -- -- 20 --  12/10/23 0845 123/78 -- -- 88 18 --  12/10/23 0831 -- -- -- -- -- 100 %  12/10/23 0823 118/84 97.6 F (36.4 C) Oral 86 18 100 %   Constitutional: Well-developed, dry mucous membranes. Cardiovascular: normal rate Respiratory: normal effort GI: Nausea and vomiting, Abd soft, non-tender, gravid appropriate for gestational age.  MS: Extremities nontender, no edema, normal ROM Neurologic: Alert and oriented x 4.  GU: unable to assess as patient is vomiting CVAT.  PELVIC EXAM: deferred   FHT:  Baseline 125 , moderate variability, accelerations present, no decelerations Contractions: q 2-3 mins   Labs: Results for orders placed or performed during the hospital encounter of 12/10/23 (from the past 24 hours)  Urinalysis, Routine w reflex microscopic -Urine, Clean Catch     Status: Abnormal   Collection Time: 12/10/23  8:33 AM  Result Value Ref Range   Color, Urine YELLOW YELLOW   APPearance HAZY (A) CLEAR   Specific Gravity, Urine 1.012 1.005 - 1.030   pH 7.0 5.0 - 8.0   Glucose, UA NEGATIVE NEGATIVE mg/dL   Hgb urine dipstick SMALL (A) NEGATIVE   Bilirubin Urine NEGATIVE NEGATIVE   Ketones, ur 20 (A) NEGATIVE mg/dL   Protein, ur NEGATIVE NEGATIVE mg/dL   Nitrite NEGATIVE NEGATIVE   Leukocytes,Ua NEGATIVE NEGATIVE   RBC / HPF 21-50 0 - 5 RBC/hpf   WBC, UA 0-5 0 - 5 WBC/hpf   Bacteria, UA NONE SEEN NONE SEEN   Squamous Epithelial / HPF 0-5 0 - 5 /HPF   Mucus PRESENT   CBC     Status: Abnormal   Collection Time: 12/10/23  9:04 AM  Result Value Ref Range   WBC 7.0 4.0 - 10.5 K/uL   RBC 3.25 (L) 3.87 - 5.11 MIL/uL   Hemoglobin 10.9 (L) 12.0 - 15.0 g/dL   HCT 18.8  (L) 41.6 - 46.0 %   MCV 96.9 80.0 - 100.0 fL   MCH 33.5 26.0 - 34.0 pg   MCHC 34.6 30.0 - 36.0 g/dL   RDW 60.6 30.1 - 60.1 %  Platelets 192 150 - 400 K/uL   nRBC 0.0 0.0 - 0.2 %  Comprehensive metabolic panel     Status: Abnormal   Collection Time: 12/10/23  9:04 AM  Result Value Ref Range   Sodium 136 135 - 145 mmol/L   Potassium 3.5 3.5 - 5.1 mmol/L   Chloride 109 98 - 111 mmol/L   CO2 18 (L) 22 - 32 mmol/L   Glucose, Bld 91 70 - 99 mg/dL   BUN 6 6 - 20 mg/dL   Creatinine, Ser 0.96 0.44 - 1.00 mg/dL   Calcium  8.1 (L) 8.9 - 10.3 mg/dL   Total Protein 5.5 (L) 6.5 - 8.1 g/dL   Albumin 2.6 (L) 3.5 - 5.0 g/dL   AST 68 (H) 15 - 41 U/L   ALT 76 (H) 0 - 44 U/L   Alkaline Phosphatase 98 38 - 126 U/L   Total Bilirubin 0.9 0.0 - 1.2 mg/dL   GFR, Estimated >04 >54 mL/min   Anion gap 9 5 - 15  Lipase, blood     Status: None   Collection Time: 12/10/23  9:04 AM  Result Value Ref Range   Lipase 32 11 - 51 U/L      Imaging:  US  RENAL Result Date: 12/10/2023 CLINICAL DATA:  Left flank pain, pregnant EXAM: RENAL / URINARY TRACT ULTRASOUND COMPLETE COMPARISON:  10/13/2018 FINDINGS: Right Kidney: Renal measurements: 11.5 x 5.7 x 4.7 cm = volume: 164 mL. Echogenicity within normal limits. No mass or hydronephrosis visualized. Left Kidney: Renal measurements: 11.7 x 5.4 x 5.5 cm = volume: 184 mL. Normal echogenicity. No cortical lesion. 1.1 cm echogenic focus in the lower pole collecting system. Mild-moderate hydronephrosis. Bladder: Incompletely distended Other: None. IMPRESSION: Mild-moderate left hydronephrosis with 1.1 cm lower pole calculus. Electronically Signed   By: Nicoletta Barrier M.D.   On: 12/10/2023 10:11    MAU Course/MDM: Orders Placed This Encounter  Procedures   US  RENAL   Urinalysis, Routine w reflex microscopic -Urine, Clean Catch   CBC   Comprehensive metabolic panel   Lipase, blood   Discharge patient Discharge disposition: 01-Home or Self Care; Discharge patient date:  12/10/2023    Meds ordered this encounter  Medications   HYDROmorphone  (DILAUDID ) injection 1 mg   lactated ringers  bolus 1,000 mL   ondansetron  (ZOFRAN ) injection 4 mg   HYDROmorphone  (DILAUDID ) injection 1 mg   lactated ringers  bolus 1,000 mL   prochlorperazine (COMPAZINE) injection 10 mg   ondansetron  (ZOFRAN -ODT) 4 MG disintegrating tablet    Sig: Take 1 tablet (4 mg total) by mouth every 6 (six) hours as needed for nausea.    Dispense:  20 tablet    Refill:  0    Supervising Provider:   Ferdie Housekeeper [[id number]]   traMADol (ULTRAM) 50 MG tablet    Sig: Take 1-2 tablets (50-100 mg total) by mouth every 6 (six) hours as needed.    Dispense:  20 tablet    Refill:  0    Supervising Provider:   Ferdie Housekeeper [[id number]]   DISCONTD: ondansetron  (ZOFRAN  ODT) 4 MG disintegrating tablet    Sig: Take 1 tablet (4 mg total) by mouth every 8 (eight) hours as needed for nausea or vomiting.    Dispense:  20 tablet    Refill:  0    Supervising Provider:   Ferdie Housekeeper [[id number]]   tamsulosin (FLOMAX) 0.4 MG CAPS capsule    Sig: Take 1 capsule (0.4 mg total) by mouth daily.  Dispense:  5 capsule    Refill:  0    Supervising Provider:   Ferdie Housekeeper [[id number]]   ondansetron  (ZOFRAN  ODT) 4 MG disintegrating tablet    Sig: Take 1 tablet (4 mg total) by mouth every 8 (eight) hours as needed for nausea or vomiting.    Dispense:  20 tablet    Refill:  0    Supervising Provider:   CRESENZO, JOHN V [[id number]]   promethazine  (PHENERGAN ) 12.5 MG tablet    Sig: Take 1 tablet (12.5 mg total) by mouth every 6 (six) hours as needed for nausea or vomiting.    Dispense:  30 tablet    Refill:  0    Supervising Provider:   Ferdie Housekeeper [phone]     NST reviewed  Treatments in MAU included CBC, CMP, Lipase, Renal US , pain and nausea medication.    Pt with improved pain and nausea, but any movement or attempt to sit up/walk cause significant pain and n/v.    Initial plan to d/c patient  with symptom management but pt symptoms remained significant.  Reviewed imaging with Dr Randel Buss, and given size of renal calculi, will consult urology. Spoke with Dr Willye Harvey, who recommends conservative management with fluids, and pain/nausea medication but consider CT scan/stent if needed.  Called Dr D'iorio who will admit pt to [facility] for treatment.  Pt stable at time of transfer.  Assessment: 1. Renal calculus, left   2. [redacted] weeks gestation of pregnancy     Plan: Admit  to [facility] Dr D'iorio to assume care   Follow-up Information     [facility] Follow up in 1 week(s).   Contact information: [address] [place] [address] [phone]         [facility] Follow up.   Specialty: Obstetrics and Gynecology Why: As needed for emergencies Contact information: [address] [place]  [phone]               Allergies as of 12/10/2023       Reactions   Erythromycin Nausea And Vomiting   Cefdinir Rash        Medication List     TAKE these medications    acetaminophen  325 MG tablet Commonly known as: TYLENOL  Take 2 tablets (650 mg total) by mouth every 6 (six) hours as needed for mild pain (or temp > 100).   aspirin EC 81 MG tablet Take 81 mg by mouth daily. Swallow whole.   buPROPion  300 MG 24 hr tablet Commonly known as: WELLBUTRIN  XL Take 300 mg by mouth daily.   busPIRone  10 MG tablet Commonly known as: BUSPAR  Take 10 mg by mouth 2 (two) times daily.   calcium  carbonate 500 MG chewable tablet Commonly known as: TUMS - dosed in mg elemental calcium  Chew 1 tablet by mouth 2 (two) times daily as needed for indigestion or heartburn.   docusate sodium  100 MG capsule Commonly known as: COLACE Take 1 capsule (100 mg total) by mouth 2 (two) times daily as needed for mild constipation.   famotidine  20 MG tablet Commonly known as: PEPCID  Take 1 tablet (20 mg total) by mouth 2 (two) times  daily. What changed:  when to take this reasons to take this   ondansetron  4 MG disintegrating tablet Commonly known as: ZOFRAN -ODT Take 1 tablet (4 mg total) by mouth every 6 (six) hours as needed for nausea. What changed: You were already taking a medication with the  same name, and this prescription was added. Make sure you understand how and when to take each.   ondansetron  4 MG disintegrating tablet Commonly known as: Zofran  ODT Take 1 tablet (4 mg total) by mouth every 8 (eight) hours as needed for nausea or vomiting. What changed: Another medication with the same name was added. Make sure you understand how and when to take each.   One-A-Day Womens Prenatal 1 28-0.8-235 MG Caps Take 1 capsule by mouth daily.   promethazine  12.5 MG tablet Commonly known as: PHENERGAN  Take 1 tablet (12.5 mg total) by mouth every 6 (six) hours as needed for nausea or vomiting.   tamsulosin 0.4 MG Caps capsule Commonly known as: FLOMAX Take 1 capsule (0.4 mg total) by mouth daily.   traMADol 50 MG tablet Commonly known as: ULTRAM Take 1-2 tablets (50-100 mg total) by mouth every 6 (six) hours as needed.        Jeffrie Minion, [facility] 12/10/2023 11:43 AM

## 2023-12-11 LAB — COMPREHENSIVE METABOLIC PANEL WITH GFR
ALT: 89 U/L — ABNORMAL HIGH (ref 0–44)
AST: 69 U/L — ABNORMAL HIGH (ref 15–41)
Albumin: 2 g/dL — ABNORMAL LOW (ref 3.5–5.0)
Alkaline Phosphatase: 81 U/L (ref 38–126)
Anion gap: 7 (ref 5–15)
BUN: <5 mg/dL — ABNORMAL LOW (ref 6–20)
CO2: 22 mmol/L (ref 22–32)
Calcium: 8 mg/dL — ABNORMAL LOW (ref 8.9–10.3)
Chloride: 109 mmol/L (ref 98–111)
Creatinine, Ser: 0.56 mg/dL (ref 0.44–1.00)
GFR, Estimated: >60 mL/min (ref >60)
Glucose, Bld: 84 mg/dL (ref 70–99)
Potassium: 3.4 mmol/L — ABNORMAL LOW (ref 3.5–5.1)
Sodium: 138 mmol/L (ref 135–145)
Total Bilirubin: 0.6 mg/dL (ref 0.0–1.2)
Total Protein: 4.3 g/dL — ABNORMAL LOW (ref 6.5–8.1)

## 2023-12-11 MED ORDER — OXYCODONE HCL 5 MG PO TABS: 5.0000 mg | ORAL_TABLET | ORAL | 0 refills | Status: DC | PRN

## 2023-12-11 MED ORDER — GABAPENTIN 300 MG PO CAPS: 300.0000 mg | ORAL_CAPSULE | Freq: Three times a day (TID) | ORAL | 0 refills | Status: DC

## 2023-12-11 NOTE — Discharge Summary (Addendum)
 Physician Discharge Summary  Patient ID: Taylor Luna MRN: [id number] DOB/AGE: 1981-02-23 43 y.o.  Admit date: 12/10/2023 Discharge date: 12/11/2023  Admission Diagnoses:  Discharge Diagnoses:  Principal Problem:   Kidney stone complicating pregnancy   Discharged Condition: good  Hospital Course: Patient was seen in MAU 5 days ago and diagnosed with COVID. I saw patient in office yesterday for some abdominal cramping. Evaluation at that time overall reassuring, cramping thought to be likely iso viral illness and dehydration, advised Tylenol  and increased fluid intake and patient sent home with return precautions. Called into answering service this morning reporting new pain now on left flank that radiates to abdomen. Feels like she cannot fully empty her bladder although she has the urge to void. Also with episodes of vomiting during pain. Found to have L renal calculus and admitted for pain control. Pain improved on standing regimen of Flomax, gabapentin and tylenol .   Consults: urology  Significant Diagnostic Studies: radiology: Ultrasound: Mild-moderate left hydronephrosis with 1.1 cm lower pole calculus.  Treatments: IV hydration and analgesia: acetaminophen  and gabapentin  Discharge Exam: Blood pressure 100/64, pulse 85, temperature 98.7 F (37.1 C), temperature source Oral, resp. rate 18, SpO2 99%, unknown if currently breastfeeding. GA: well appearing, NAD Chest: normal work of breathing on room air  Abd: soft, nontender, gravid Back: + mild L CVAT, no R CVAT   AM NST: reactive FHT: baseline 130bpm, moderate variability, + accels, - decels Toco: rare irregular ctxs     Disposition: Discharge disposition: 01-Home or Self Care        Allergies as of 12/11/2023       Reactions   Erythromycin Nausea And Vomiting   Cefdinir Rash        Medication List     TAKE these medications    acetaminophen  325 MG tablet Commonly known as: TYLENOL  Take 2 tablets  (650 mg total) by mouth every 6 (six) hours as needed for mild pain (or temp > 100).   aspirin EC 81 MG tablet Take 81 mg by mouth daily. Swallow whole.   calcium  carbonate 500 MG chewable tablet Commonly known as: TUMS - dosed in mg elemental calcium  Chew 1 tablet by mouth 2 (two) times daily as needed for indigestion or heartburn.   docusate sodium  100 MG capsule Commonly known as: COLACE Take 1 capsule (100 mg total) by mouth 2 (two) times daily as needed for mild constipation.   famotidine  20 MG tablet Commonly known as: PEPCID  Take 1 tablet (20 mg total) by mouth 2 (two) times daily. What changed:  when to take this reasons to take this   gabapentin 300 MG capsule Commonly known as: NEURONTIN Take 1 capsule (300 mg total) by mouth 3 (three) times daily.   ondansetron  4 MG disintegrating tablet Commonly known as: ZOFRAN -ODT Take 1 tablet (4 mg total) by mouth every 6 (six) hours as needed for nausea. What changed: You were already taking a medication with the same name, and this prescription was added. Make sure you understand how and when to take each.   ondansetron  4 MG disintegrating tablet Commonly known as: Zofran  ODT Take 1 tablet (4 mg total) by mouth every 8 (eight) hours as needed for nausea or vomiting. What changed: Another medication with the same name was added. Make sure you understand how and when to take each.   One-A-Day Womens Prenatal 1 28-0.8-235 MG Caps Take 1 capsule by mouth daily.   oxyCODONE  5 MG immediate release tablet Commonly known  as: Roxicodone  Take 1 tablet (5 mg total) by mouth every 4 (four) hours as needed for severe pain (pain score 7-10).   promethazine  12.5 MG tablet Commonly known as: PHENERGAN  Take 1 tablet (12.5 mg total) by mouth every 6 (six) hours as needed for nausea or vomiting.   tamsulosin 0.4 MG Caps capsule Commonly known as: FLOMAX Take 1 capsule (0.4 mg total) by mouth daily.        Follow-up Information      [facility] Follow up in 1 week(s).   Contact information: [address] [place] [address] [phone]         [facility] Follow up.   Specialty: Obstetrics and Gynecology Why: As needed for emergencies Contact information: [address] [place]  [phone]                Signed: Tresa Frohlich D'iorio 12/11/2023, 11:36 AM

## 2023-12-11 NOTE — Progress Notes (Addendum)
 OB Antepartum Progress Note HD#2 Kidney stones   S: Reports doing well this morning with pain improved. Able to sleep overnight. Tolerating regular diet. Voiding without issue.   No PRN pain medication required overnight.   O:  Vitals:   12/10/23 1956 12/10/23 2355  BP: 100/60 (!) 96/52  Pulse: 84 83  Resp: 18 20  Temp: 98.5 F (36.9 C) 98.6 F (37 C)  SpO2: 96% 98%    Physical Exam: GA: well appearing, NAD Chest: normal work of breathing on room air  Abd: soft, nontender, gravid Back: + mild L CVAT, no R CVAT   AM NST: reactive FHT: baseline 130bpm, moderate variability, + accels, - decels Toco: rare irregular ctxs    Labs:  UA: small blood and ketones H/H: 10.9/31.5 Rh neg Lab Results  Component Value Date   CREATININE 0.60 12/10/2023   Lab Results  Component Value Date   ALT 76 (H) 12/10/2023   AST 68 (H) 12/10/2023   ALKPHOS 98 12/10/2023   BILITOT 0.9 12/10/2023     Imaging: Renal U/S: IMPRESSION: Mild-moderate left hydronephrosis with 1.1 cm lower pole calculus.     A/P:  42yo G3P2002 at [redacted]w[redacted]d w/recent COVID infection now p/w flank and abdominal pain, difficulty urinating found to have 1.1cm left renal calculus. Discussed with patient staying for continued monitoring versus discharge home with pain regimen. Difficult to know when stones will pass and could be weeks. Patient prefers to be discharged home.    #GU - L kidney stone - IV LR @ 125cc/h - Flomax daily  - Pain control - Reconsult urology as needed if sxs do not improve    #Neuro/pain - Tylenol  1000mg  q6h  - Gabapentin 300mg  TID - PO oxycodone  5mg  q4h PRN for moderate pain - IV dilaudid  1mg  q2h PRN for breakthrough pain    #GI - Zofran  and compazine PRN for nausea  - Pepcid  and TUMS prn for refulx  - Miralax  PRN   #Routine OB - Regular diet - PNV  - ASA 81mg  daily - PPX; SCD's when in bed  - Rh neg, for Rhogam at next outpatient visit or if bleeding during admission    #Neonate - Daily NST    Ellsworth Haas, MD

## 2023-12-12 ENCOUNTER — Telehealth: Payer: Self-pay | Admitting: Lactation Services

## 2023-12-12 NOTE — Telephone Encounter (Signed)
 Received PA request for Tramadol. Per chart review, Rx was discontinued at discharge. Called [facility] and informed them to cancel Rx.

## 2024-01-19 LAB — OB RESULTS CONSOLE GBS: GBS: NEGATIVE

## 2024-02-02 ENCOUNTER — Telehealth (HOSPITAL_COMMUNITY): Payer: Self-pay | Admitting: *Deleted

## 2024-02-02 NOTE — Telephone Encounter (Signed)
 Preadmission screen

## 2024-02-05 ENCOUNTER — Inpatient Hospital Stay (HOSPITAL_COMMUNITY)

## 2024-02-05 ENCOUNTER — Inpatient Hospital Stay (HOSPITAL_COMMUNITY)
Admission: AD | Admit: 2024-02-05 | Discharge: 2024-02-06 | Disposition: A | Discharge status: Home / Self Care | Source: Home / Self Care | Attending: Obstetrics and Gynecology | Admitting: Obstetrics and Gynecology

## 2024-02-05 ENCOUNTER — Encounter (HOSPITAL_COMMUNITY): Payer: Self-pay | Admitting: Obstetrics and Gynecology

## 2024-02-05 DIAGNOSIS — Z3A38 38 weeks gestation of pregnancy: Secondary | ICD-10-CM | POA: Insufficient documentation

## 2024-02-05 DIAGNOSIS — R319 Hematuria, unspecified: Secondary | ICD-10-CM

## 2024-02-05 DIAGNOSIS — N132 Hydronephrosis with renal and ureteral calculous obstruction: Secondary | ICD-10-CM | POA: Insufficient documentation

## 2024-02-05 DIAGNOSIS — N200 Calculus of kidney: Secondary | ICD-10-CM

## 2024-02-05 DIAGNOSIS — Z3689 Encounter for other specified antenatal screening: Secondary | ICD-10-CM

## 2024-02-05 DIAGNOSIS — O99891 Other specified diseases and conditions complicating pregnancy: Secondary | ICD-10-CM | POA: Insufficient documentation

## 2024-02-05 DIAGNOSIS — Z3493 Encounter for supervision of normal pregnancy, unspecified, third trimester: Secondary | ICD-10-CM

## 2024-02-05 LAB — CBC WITH DIFFERENTIAL/PLATELET
Abs Immature Granulocytes: 0.25 10*3/uL — ABNORMAL HIGH (ref 0.00–0.07)
Basophils Absolute: 0 10*3/uL (ref 0.0–0.1)
Basophils Relative: 0 %
Eosinophils Absolute: 0 10*3/uL (ref 0.0–0.5)
Eosinophils Relative: 0 %
HCT: 32.4 % — ABNORMAL LOW (ref 36.0–46.0)
Hemoglobin: 11.1 g/dL — ABNORMAL LOW (ref 12.0–15.0)
Immature Granulocytes: 2 %
Lymphocytes Relative: 15 %
Lymphs Abs: 1.9 10*3/uL (ref 0.7–4.0)
MCH: 33.7 pg (ref 26.0–34.0)
MCHC: 34.3 g/dL (ref 30.0–36.0)
MCV: 98.5 fL (ref 80.0–100.0)
Monocytes Absolute: 0.8 10*3/uL (ref 0.1–1.0)
Monocytes Relative: 6 %
Neutro Abs: 9.8 10*3/uL — ABNORMAL HIGH (ref 1.7–7.7)
Neutrophils Relative %: 77 %
Platelets: 232 10*3/uL (ref 150–400)
RBC: 3.29 MIL/uL — ABNORMAL LOW (ref 3.87–5.11)
RDW: 13.2 % (ref 11.5–15.5)
WBC: 12.8 10*3/uL — ABNORMAL HIGH (ref 4.0–10.5)
nRBC: 0 % (ref 0.0–0.2)

## 2024-02-05 LAB — URINALYSIS, ROUTINE W REFLEX MICROSCOPIC
Bacteria, UA: NONE SEEN
Bilirubin Urine: NEGATIVE
Glucose, UA: NEGATIVE mg/dL
Ketones, ur: NEGATIVE mg/dL
Nitrite: NEGATIVE
Protein, ur: NEGATIVE mg/dL
Specific Gravity, Urine: 1.008 (ref 1.005–1.030)
pH: 6 (ref 5.0–8.0)

## 2024-02-05 LAB — COMPREHENSIVE METABOLIC PANEL WITH GFR
ALT: 76 U/L — ABNORMAL HIGH (ref 0–44)
AST: 47 U/L — ABNORMAL HIGH (ref 15–41)
Albumin: 2.7 g/dL — ABNORMAL LOW (ref 3.5–5.0)
Alkaline Phosphatase: 162 U/L — ABNORMAL HIGH (ref 38–126)
Anion gap: 12 (ref 5–15)
BUN: 7 mg/dL (ref 6–20)
CO2: 18 mmol/L — ABNORMAL LOW (ref 22–32)
Calcium: 8.6 mg/dL — ABNORMAL LOW (ref 8.9–10.3)
Chloride: 105 mmol/L (ref 98–111)
Creatinine, Ser: 0.8 mg/dL (ref 0.44–1.00)
GFR, Estimated: >60 mL/min (ref >60)
Glucose, Bld: 86 mg/dL (ref 70–99)
Potassium: 3.1 mmol/L — ABNORMAL LOW (ref 3.5–5.1)
Sodium: 135 mmol/L (ref 135–145)
Total Bilirubin: 0.7 mg/dL (ref 0.0–1.2)
Total Protein: 5.9 g/dL — ABNORMAL LOW (ref 6.5–8.1)

## 2024-02-05 MED ORDER — HYDROMORPHONE HCL 1 MG/ML IJ SOLN
1.0000 mg | INTRAMUSCULAR | Status: DC | PRN
  Administered 2024-02-05: 1 mg via INTRAVENOUS
  Filled 2024-02-05: qty 1

## 2024-02-05 MED ORDER — HYDROMORPHONE HCL 1 MG/ML IJ SOLN
1.0000 mg | Freq: Once | INTRAMUSCULAR | Status: AC
  Administered 2024-02-05: 1 mg via INTRAVENOUS
  Filled 2024-02-05: qty 1

## 2024-02-05 MED ORDER — ACETAMINOPHEN 500 MG PO TABS
1000.0000 mg | ORAL_TABLET | Freq: Once | ORAL | Status: AC
  Administered 2024-02-05: 1000 mg via ORAL
  Filled 2024-02-05: qty 2

## 2024-02-05 MED ORDER — LACTATED RINGERS IV BOLUS
1000.0000 mL | Freq: Once | INTRAVENOUS | Status: AC
  Administered 2024-02-05: 1000 mL via INTRAVENOUS

## 2024-02-05 NOTE — MAU Note (Signed)
 Taylor Luna is a 43 y.o. at [redacted]w[redacted]d here in MAU reporting: not being able to urinate that started earlier today, states she has been hydrating. Slight burning with urination and feeling like she is unable to empty her bladder. Left flank pain that tender to the touch. States her whole lower back is hurting with ctxs. Ctxs every 10-15 minutes. Denies any LOF or VB. Reports +FM  Increase in pelvic/rectal pressure.   Kidney stones in April, have passed a few stones since then but still have larger ones present.  Onset of complaint: today  Pain score: L flank pain  8  contraction 8 Vitals:   02/05/24 1805  BP: 117/77  Pulse: (!) 102  Resp: 18  Temp: 97.6 F (36.4 C)  SpO2: 98%     FHT:pt wearing a dress  Lab orders placed from triage:  UA

## 2024-02-05 NOTE — MAU Provider Note (Cosign Needed Addendum)
 History     CSN: [id number]  Arrival date and time: 02/05/24 1734   Event Date/Time   First Provider Initiated Contact with Patient 02/05/24 1837      Chief Complaint  Patient presents with   Back Pain   Contractions   Taylor Luna , a  43 y.o. G3P2002 at [redacted]w[redacted]d presents to MAU with complaints of left sided flank pain and decreased urination. Patient states that she noted progressive pain in LL flank. Describes as sharp, consistent pain, that is worsened by movement and during urination. Currently rates pain as a 7/10. Denies attempting to relieve symptoms. She states that throughout the day today she has also noted a significant decrease in urine output and reports does not feel as if she is emptying her bladder when she does go. She also reports increased frequency. She also complains of contractions, but states that since arrival to MAU they have spaced out to 10-20 mins. She states that the back pain is worse than her abdominal contraction pain.        OB History     Gravida  3   Para  2   Term  2   Preterm      AB      Living  2      SAB      IAB      Ectopic      Multiple  0   Live Births  2           Past Medical History:  Diagnosis Date   Adnexal mass    Carcinoma in situ of cervix uteri    Cholecystitis 09/2018   Depression with anxiety    Dizziness    Hx of condyloma acuminatum    Pregnancy 2008   second pregnancy Jan 2020   Vaginal Pap smear, abnormal     Past Surgical History:  Procedure Laterality Date   broken nose  2008   surgery to repair   CHOLECYSTECTOMY N/A 10/11/2018   Procedure: LAPAROSCOPIC CHOLECYSTECTOMY;  Surgeon: Debby Hila, MD;  Location: [facility];  Service: General;  Laterality: N/A;   LEEP      Family History  Problem Relation Age of Onset   Lupus Mother    Hypertension Father    Diabetes Maternal Grandmother    Cancer Maternal Grandmother    Cancer Maternal Grandfather    Hypertension Paternal  Grandmother     Social History   Tobacco Use   Smoking status: Never   Smokeless tobacco: Never  Vaping Use   Vaping status: Never Used  Substance Use Topics   Alcohol use: No   Drug use: No    Allergies:  Allergies  Allergen Reactions   Erythromycin Nausea And Vomiting   Cefdinir Rash    No medications prior to admission.    Review of Systems  Constitutional:  Negative for chills, fatigue and fever.  Eyes:  Negative for pain and visual disturbance.  Respiratory:  Negative for apnea, shortness of breath and wheezing.   Cardiovascular:  Negative for chest pain and palpitations.  Gastrointestinal:  Positive for abdominal pain and nausea. Negative for constipation, diarrhea and vomiting.  Genitourinary:  Positive for dysuria, flank pain, hematuria and pelvic pain. Negative for difficulty urinating, vaginal bleeding, vaginal discharge and vaginal pain.  Musculoskeletal:  Positive for back pain.  Neurological:  Negative for seizures, weakness and headaches.  Psychiatric/Behavioral:  Negative for suicidal ideas.    Physical Exam   Blood pressure  106/72, pulse 92, temperature 97.6 F (36.4 C), temperature source Oral, resp. rate 18, height 5' 3 (1.6 m), weight 56.7 kg, SpO2 100%, unknown if currently breastfeeding.  Physical Exam Vitals and nursing note reviewed.  Constitutional:      General: She is not in acute distress.    Appearance: Normal appearance. She is ill-appearing.  HENT:     Head: Normocephalic.   Cardiovascular:     Rate and Rhythm: Tachycardia present.  Pulmonary:     Effort: Pulmonary effort is normal.  Abdominal:     Tenderness: There is left CVA tenderness.     Comments: Pt declined abdominal exam due to tenderness.    Musculoskeletal:     Cervical back: Normal range of motion.   Skin:    General: Skin is warm and dry.     Capillary Refill: Capillary refill takes 2 to 3 seconds.   Neurological:     Mental Status: She is alert and oriented  to person, place, and time.   Psychiatric:        Mood and Affect: Mood normal.    FHT: 125 bpm with moderate variability 15x15 accels no decels  -Toco: 3-10 mins. Patient unaware of some.   MAU Course  Procedures Orders Placed This Encounter  Procedures   US  RENAL   Urinalysis, Routine w reflex microscopic -Urine, Clean Catch   CBC with Differential/Platelet   Comprehensive metabolic panel   Discharge patient   Results for orders placed or performed during the hospital encounter of 02/05/24 (from the past 24 hours)  Urinalysis, Routine w reflex microscopic -Urine, Clean Catch     Status: Abnormal   Collection Time: 02/05/24  6:23 PM  Result Value Ref Range   Color, Urine YELLOW YELLOW   APPearance CLEAR CLEAR   Specific Gravity, Urine 1.008 1.005 - 1.030   pH 6.0 5.0 - 8.0   Glucose, UA NEGATIVE NEGATIVE mg/dL   Hgb urine dipstick MODERATE (A) NEGATIVE   Bilirubin Urine NEGATIVE NEGATIVE   Ketones, ur NEGATIVE NEGATIVE mg/dL   Protein, ur NEGATIVE NEGATIVE mg/dL   Nitrite NEGATIVE NEGATIVE   Leukocytes,Ua SMALL (A) NEGATIVE   RBC / HPF 6-10 0 - 5 RBC/hpf   WBC, UA 6-10 0 - 5 WBC/hpf   Bacteria, UA NONE SEEN NONE SEEN   Squamous Epithelial / HPF 0-5 0 - 5 /HPF   Hyaline Casts, UA PRESENT   CBC with Differential/Platelet     Status: Abnormal   Collection Time: 02/05/24  6:50 PM  Result Value Ref Range   WBC 12.8 (H) 4.0 - 10.5 K/uL   RBC 3.29 (L) 3.87 - 5.11 MIL/uL   Hemoglobin 11.1 (L) 12.0 - 15.0 g/dL   HCT 67.5 (L) 63.9 - 53.9 %   MCV 98.5 80.0 - 100.0 fL   MCH 33.7 26.0 - 34.0 pg   MCHC 34.3 30.0 - 36.0 g/dL   RDW 86.7 88.4 - 84.4 %   Platelets 232 150 - 400 K/uL   nRBC 0.0 0.0 - 0.2 %   Neutrophils Relative % 77 %   Neutro Abs 9.8 (H) 1.7 - 7.7 K/uL   Lymphocytes Relative 15 %   Lymphs Abs 1.9 0.7 - 4.0 K/uL   Monocytes Relative 6 %   Monocytes Absolute 0.8 0.1 - 1.0 K/uL   Eosinophils Relative 0 %   Eosinophils Absolute 0.0 0.0 - 0.5 K/uL   Basophils  Relative 0 %   Basophils Absolute 0.0 0.0 - 0.1 K/uL  Immature Granulocytes 2 %   Abs Immature Granulocytes 0.25 (H) 0.00 - 0.07 K/uL  Comprehensive metabolic panel     Status: Abnormal   Collection Time: 02/05/24  6:50 PM  Result Value Ref Range   Sodium 135 135 - 145 mmol/L   Potassium 3.1 (L) 3.5 - 5.1 mmol/L   Chloride 105 98 - 111 mmol/L   CO2 18 (L) 22 - 32 mmol/L   Glucose, Bld 86 70 - 99 mg/dL   BUN 7 6 - 20 mg/dL   Creatinine, Ser 9.19 0.44 - 1.00 mg/dL   Calcium  8.6 (L) 8.9 - 10.3 mg/dL   Total Protein 5.9 (L) 6.5 - 8.1 g/dL   Albumin 2.7 (L) 3.5 - 5.0 g/dL   AST 47 (H) 15 - 41 U/L   ALT 76 (H) 0 - 44 U/L   Alkaline Phosphatase 162 (H) 38 - 126 U/L   Total Bilirubin 0.7 0.0 - 1.2 mg/dL   GFR, Estimated >39 >39 mL/min   Anion gap 12 5 - 15    MDM - Bladder scanned and noted . Offered to insert a catheter for relief and patient declined at this time.  - IV dilaudid  ordered.for pain.  - White count mildly elevated. 12.8 - Hematuria noted on UA, concern for kidney stone, renal US  ordered.  - Report given to Camie Butler Potters, CNM @ [phone]. Raahil Ong Erven) Emilio, MSN, CNM  [facility]  02/06/2024 4:07 AM    - Renal US  reassuring with non-obstructing stones seen consistent with previous scans.  - IV fluid bolus ordered, patient able to void several times in greater quantity than previous amounts.  - IV dilaudid  effective for pain with oxycodone  ordered here with relief and for home.  - Nausea relieved by phenergan .  - Patient desires discharge and has OB appointment this week and IOL scheduled.  - Consulted Dr. Erik who recommended outpatient pain control, nausea and Flomax  until IOL. GLENWOOD Alan Molt, CNM  patient's provider aware of plan.  Assessment and Plan   Nephrolithiasis - Plan: Discharge patient  Hematuria, unspecified type  [redacted] weeks gestation of pregnancy  NST (non-stress test) reactive  Movement of fetus present during  pregnancy in third trimester  Prescriptions sent for outpatient management of kidney stones: - Phenergan  12.5mg  q 6h PRN for nausea. - Oxycodone  5mg  q 6h for pain.  - Flomax  0.4mg  daily   Continue care with [facility] and present to L&D for IOL as scheduled. Patient encouraged return to MAU as needed.   Camie Rote, MSN, CNM, RNC-OB Certified Nurse Midwife, [facility] 02/06/2024 4:12 AM

## 2024-02-06 ENCOUNTER — Encounter (HOSPITAL_COMMUNITY): Payer: Self-pay | Admitting: Obstetrics and Gynecology

## 2024-02-06 ENCOUNTER — Inpatient Hospital Stay (HOSPITAL_COMMUNITY): Admitting: Anesthesiology

## 2024-02-06 ENCOUNTER — Other Ambulatory Visit: Payer: Self-pay

## 2024-02-06 ENCOUNTER — Inpatient Hospital Stay (HOSPITAL_COMMUNITY)
Admission: AD | Admit: 2024-02-06 | Discharge: 2024-02-07 | DRG: 768 | Disposition: A | Discharge status: Home / Self Care | Attending: Obstetrics and Gynecology | Admitting: Obstetrics and Gynecology

## 2024-02-06 ENCOUNTER — Other Ambulatory Visit: Payer: Self-pay | Admitting: Certified Nurse Midwife

## 2024-02-06 DIAGNOSIS — Z79899 Other long term (current) drug therapy: Secondary | ICD-10-CM | POA: Diagnosis not present

## 2024-02-06 DIAGNOSIS — Z8616 Personal history of COVID-19: Secondary | ICD-10-CM | POA: Diagnosis not present

## 2024-02-06 DIAGNOSIS — O368130 Decreased fetal movements, third trimester, not applicable or unspecified: Secondary | ICD-10-CM | POA: Diagnosis present

## 2024-02-06 DIAGNOSIS — O26893 Other specified pregnancy related conditions, third trimester: Secondary | ICD-10-CM

## 2024-02-06 DIAGNOSIS — Z3A38 38 weeks gestation of pregnancy: Secondary | ICD-10-CM | POA: Diagnosis not present

## 2024-02-06 DIAGNOSIS — O99891 Other specified diseases and conditions complicating pregnancy: Secondary | ICD-10-CM

## 2024-02-06 DIAGNOSIS — O9902 Anemia complicating childbirth: Secondary | ICD-10-CM | POA: Diagnosis present

## 2024-02-06 DIAGNOSIS — O26833 Pregnancy related renal disease, third trimester: Secondary | ICD-10-CM | POA: Diagnosis present

## 2024-02-06 DIAGNOSIS — N200 Calculus of kidney: Secondary | ICD-10-CM | POA: Diagnosis present

## 2024-02-06 DIAGNOSIS — Z8249 Family history of ischemic heart disease and other diseases of the circulatory system: Secondary | ICD-10-CM | POA: Diagnosis not present

## 2024-02-06 DIAGNOSIS — Z833 Family history of diabetes mellitus: Secondary | ICD-10-CM | POA: Diagnosis not present

## 2024-02-06 DIAGNOSIS — R7989 Other specified abnormal findings of blood chemistry: Secondary | ICD-10-CM | POA: Diagnosis present

## 2024-02-06 DIAGNOSIS — K219 Gastro-esophageal reflux disease without esophagitis: Secondary | ICD-10-CM | POA: Diagnosis present

## 2024-02-06 DIAGNOSIS — R7401 Elevation of levels of liver transaminase levels: Secondary | ICD-10-CM | POA: Diagnosis present

## 2024-02-06 DIAGNOSIS — O9962 Diseases of the digestive system complicating childbirth: Secondary | ICD-10-CM | POA: Diagnosis present

## 2024-02-06 DIAGNOSIS — Z7982 Long term (current) use of aspirin: Secondary | ICD-10-CM | POA: Diagnosis not present

## 2024-02-06 DIAGNOSIS — O99892 Other specified diseases and conditions complicating childbirth: Secondary | ICD-10-CM | POA: Diagnosis present

## 2024-02-06 DIAGNOSIS — Z6791 Unspecified blood type, Rh negative: Secondary | ICD-10-CM

## 2024-02-06 DIAGNOSIS — O26899 Other specified pregnancy related conditions, unspecified trimester: Secondary | ICD-10-CM

## 2024-02-06 DIAGNOSIS — N9089 Other specified noninflammatory disorders of vulva and perineum: Secondary | ICD-10-CM | POA: Diagnosis present

## 2024-02-06 DIAGNOSIS — L918 Other hypertrophic disorders of the skin: Secondary | ICD-10-CM | POA: Diagnosis present

## 2024-02-06 DIAGNOSIS — Z3490 Encounter for supervision of normal pregnancy, unspecified, unspecified trimester: Secondary | ICD-10-CM | POA: Diagnosis present

## 2024-02-06 HISTORY — DX: Anemia, unspecified: D64.9

## 2024-02-06 LAB — CBC
HCT: 29.2 % — ABNORMAL LOW (ref 36.0–46.0)
Hemoglobin: 9.9 g/dL — ABNORMAL LOW (ref 12.0–15.0)
MCH: 33.9 pg (ref 26.0–34.0)
MCHC: 33.9 g/dL (ref 30.0–36.0)
MCV: 100 fL (ref 80.0–100.0)
Platelets: 209 10*3/uL (ref 150–400)
RBC: 2.92 MIL/uL — ABNORMAL LOW (ref 3.87–5.11)
RDW: 13.3 % (ref 11.5–15.5)
WBC: 10.8 10*3/uL — ABNORMAL HIGH (ref 4.0–10.5)
nRBC: 0 % (ref 0.0–0.2)

## 2024-02-06 LAB — COMPREHENSIVE METABOLIC PANEL WITH GFR
ALT: 92 U/L — ABNORMAL HIGH (ref 0–44)
AST: 59 U/L — ABNORMAL HIGH (ref 15–41)
Albumin: 2.3 g/dL — ABNORMAL LOW (ref 3.5–5.0)
Alkaline Phosphatase: 158 U/L — ABNORMAL HIGH (ref 38–126)
Anion gap: 10 (ref 5–15)
BUN: 7 mg/dL (ref 6–20)
CO2: 20 mmol/L — ABNORMAL LOW (ref 22–32)
Calcium: 7.9 mg/dL — ABNORMAL LOW (ref 8.9–10.3)
Chloride: 107 mmol/L (ref 98–111)
Creatinine, Ser: 0.95 mg/dL (ref 0.44–1.00)
GFR, Estimated: >60 mL/min (ref >60)
Glucose, Bld: 70 mg/dL (ref 70–99)
Potassium: 4.1 mmol/L (ref 3.5–5.1)
Sodium: 137 mmol/L (ref 135–145)
Total Bilirubin: 1.1 mg/dL (ref 0.0–1.2)
Total Protein: 5.3 g/dL — ABNORMAL LOW (ref 6.5–8.1)

## 2024-02-06 LAB — RPR: RPR Ser Ql: NONREACTIVE

## 2024-02-06 LAB — TYPE AND SCREEN
ABO/RH(D): O NEG
Antibody Screen: POSITIVE

## 2024-02-06 MED ORDER — WITCH HAZEL-GLYCERIN EX PADS: 1.0000 | MEDICATED_PAD | CUTANEOUS | Status: DC | PRN

## 2024-02-06 MED ORDER — EPHEDRINE 5 MG/ML INJ: 10.0000 mg | INTRAVENOUS | Status: DC | PRN

## 2024-02-06 MED ORDER — SIMETHICONE 80 MG PO CHEW: 80.0000 mg | CHEWABLE_TABLET | ORAL | Status: DC | PRN

## 2024-02-06 MED ORDER — SODIUM CHLORIDE 0.9 % IV SOLN
12.5000 mg | Freq: Four times a day (QID) | INTRAVENOUS | Status: DC | PRN
  Administered 2024-02-06: 12.5 mg via INTRAVENOUS
  Filled 2024-02-06: qty 12.5

## 2024-02-06 MED ORDER — SOD CITRATE-CITRIC ACID 500-334 MG/5ML PO SOLN: 30.0000 mL | ORAL | Status: DC | PRN

## 2024-02-06 MED ORDER — TERBUTALINE SULFATE 1 MG/ML IJ SOLN: 0.2500 mg | Freq: Once | INTRAMUSCULAR | Status: DC | PRN

## 2024-02-06 MED ORDER — SODIUM CHLORIDE 0.9% FLUSH: 3.0000 mL | Freq: Two times a day (BID) | INTRAVENOUS | Status: DC

## 2024-02-06 MED ORDER — BENZOCAINE-MENTHOL 20-0.5 % EX AERO
1.0000 | INHALATION_SPRAY | CUTANEOUS | Status: DC | PRN
Start: 2024-02-06 — End: 2024-02-08

## 2024-02-06 MED ORDER — FENTANYL CITRATE (PF) 100 MCG/2ML IJ SOLN: 50.0000 ug | INTRAMUSCULAR | Status: DC | PRN

## 2024-02-06 MED ORDER — LACTATED RINGERS IV SOLN: INTRAVENOUS | Status: DC

## 2024-02-06 MED ORDER — OXYTOCIN BOLUS FROM INFUSION
333.0000 mL | Freq: Once | INTRAVENOUS | Status: AC
Start: 2024-02-06 — End: 2024-02-06
  Administered 2024-02-06: 333 mL via INTRAVENOUS

## 2024-02-06 MED ORDER — SODIUM CHLORIDE 0.9% FLUSH: 3.0000 mL | INTRAVENOUS | Status: DC | PRN

## 2024-02-06 MED ORDER — SENNOSIDES-DOCUSATE SODIUM 8.6-50 MG PO TABS
2.0000 | ORAL_TABLET | Freq: Every day | ORAL | Status: DC
  Administered 2024-02-07: 2 via ORAL
  Filled 2024-02-06: qty 2

## 2024-02-06 MED ORDER — LIDOCAINE HCL (PF) 1 % IJ SOLN: 30.0000 mL | INTRAMUSCULAR | Status: DC | PRN

## 2024-02-06 MED ORDER — PHENYLEPHRINE 80 MCG/ML (10ML) SYRINGE FOR IV PUSH (FOR BLOOD PRESSURE SUPPORT): 80.0000 ug | PREFILLED_SYRINGE | INTRAVENOUS | Status: DC | PRN

## 2024-02-06 MED ORDER — OXYCODONE HCL 5 MG PO TABS: 5.0000 mg | ORAL_TABLET | ORAL | 0 refills | Status: DC | PRN

## 2024-02-06 MED ORDER — FENTANYL-BUPIVACAINE-NACL 0.5-0.125-0.9 MG/250ML-% EP SOLN
12.0000 mL/h | EPIDURAL | Status: DC | PRN
  Administered 2024-02-06: 1 mL/h via EPIDURAL
  Filled 2024-02-06: qty 250

## 2024-02-06 MED ORDER — OXYTOCIN-SODIUM CHLORIDE 30-0.9 UT/500ML-% IV SOLN
2.5000 [IU]/h | INTRAVENOUS | Status: DC
  Filled 2024-02-06: qty 500

## 2024-02-06 MED ORDER — DIPHENHYDRAMINE HCL 25 MG PO CAPS: 25.0000 mg | ORAL_CAPSULE | Freq: Four times a day (QID) | ORAL | Status: DC | PRN

## 2024-02-06 MED ORDER — DIPHENHYDRAMINE HCL 50 MG/ML IJ SOLN: 12.5000 mg | INTRAMUSCULAR | Status: DC | PRN

## 2024-02-06 MED ORDER — KETOROLAC TROMETHAMINE 30 MG/ML IJ SOLN
30.0000 mg | Freq: Once | INTRAMUSCULAR | Status: DC
  Filled 2024-02-06: qty 1

## 2024-02-06 MED ORDER — TETANUS-DIPHTH-ACELL PERTUSSIS 5-2.5-18.5 LF-MCG/0.5 IM SUSY: 0.5000 mL | PREFILLED_SYRINGE | Freq: Once | INTRAMUSCULAR | Status: DC

## 2024-02-06 MED ORDER — COCONUT OIL OIL: 1.0000 | TOPICAL_OIL | Status: DC | PRN

## 2024-02-06 MED ORDER — LACTATED RINGERS IV SOLN
500.0000 mL | Freq: Once | INTRAVENOUS | Status: AC
  Administered 2024-02-06: 500 mL via INTRAVENOUS

## 2024-02-06 MED ORDER — ACETAMINOPHEN 500 MG PO TABS: 1000.0000 mg | ORAL_TABLET | Freq: Four times a day (QID) | ORAL | Status: DC | PRN

## 2024-02-06 MED ORDER — ONDANSETRON HCL 4 MG PO TABS: 4.0000 mg | ORAL_TABLET | ORAL | Status: DC | PRN

## 2024-02-06 MED ORDER — ACETAMINOPHEN 325 MG PO TABS: 650.0000 mg | ORAL_TABLET | ORAL | Status: DC | PRN

## 2024-02-06 MED ORDER — DIBUCAINE (PERIANAL) 1 % EX OINT: 1.0000 | TOPICAL_OINTMENT | CUTANEOUS | Status: DC | PRN

## 2024-02-06 MED ORDER — PROMETHAZINE HCL 12.5 MG PO TABS
12.5000 mg | ORAL_TABLET | Freq: Four times a day (QID) | ORAL | 0 refills | Status: DC | PRN
Start: 2024-02-06 — End: 2024-02-07

## 2024-02-06 MED ORDER — OXYCODONE HCL 5 MG PO TABS
5.0000 mg | ORAL_TABLET | Freq: Once | ORAL | Status: AC
  Administered 2024-02-06: 5 mg via ORAL
  Filled 2024-02-06: qty 1

## 2024-02-06 MED ORDER — METHYLERGONOVINE MALEATE 0.2 MG/ML IJ SOLN: 0.2000 mg | Freq: Once | INTRAMUSCULAR | Status: AC

## 2024-02-06 MED ORDER — TRANEXAMIC ACID-NACL 1000-0.7 MG/100ML-% IV SOLN
INTRAVENOUS | Status: AC
  Administered 2024-02-06: 1000 mg
  Filled 2024-02-06: qty 100

## 2024-02-06 MED ORDER — OXYTOCIN-SODIUM CHLORIDE 30-0.9 UT/500ML-% IV SOLN
1.0000 m[IU]/min | INTRAVENOUS | Status: DC
  Administered 2024-02-06: 2 m[IU]/min via INTRAVENOUS

## 2024-02-06 MED ORDER — LACTATED RINGERS IV SOLN: 500.0000 mL | INTRAVENOUS | Status: DC | PRN

## 2024-02-06 MED ORDER — LIDOCAINE HCL (PF) 1 % IJ SOLN
INTRAMUSCULAR | Status: DC | PRN
  Administered 2024-02-06: 4 mL via EPIDURAL
  Administered 2024-02-06: 2 mL via EPIDURAL

## 2024-02-06 MED ORDER — ONDANSETRON HCL 4 MG/2ML IJ SOLN: 4.0000 mg | INTRAMUSCULAR | Status: DC | PRN

## 2024-02-06 MED ORDER — LACTATED RINGERS IV SOLN: 500.0000 mL | Freq: Once | INTRAVENOUS | Status: DC

## 2024-02-06 MED ORDER — OXYTOCIN 10 UNIT/ML IJ SOLN: 10.0000 [IU] | Freq: Once | INTRAMUSCULAR | Status: DC

## 2024-02-06 MED ORDER — TAMSULOSIN HCL 0.4 MG PO CAPS: 0.4000 mg | ORAL_CAPSULE | Freq: Every day | ORAL | 0 refills | Status: DC

## 2024-02-06 MED ORDER — ONDANSETRON HCL 4 MG/2ML IJ SOLN
4.0000 mg | Freq: Four times a day (QID) | INTRAMUSCULAR | Status: DC | PRN
  Administered 2024-02-06 (×2): 4 mg via INTRAVENOUS
  Filled 2024-02-06 (×2): qty 2

## 2024-02-06 MED ORDER — SODIUM CHLORIDE 0.9 % IV SOLN: INTRAVENOUS | Status: DC | PRN

## 2024-02-06 MED ORDER — PRENATAL MULTIVITAMIN CH
1.0000 | ORAL_TABLET | Freq: Every day | ORAL | Status: DC
Start: 2024-02-07 — End: 2024-02-08
  Administered 2024-02-07: 1 via ORAL
  Filled 2024-02-06: qty 1

## 2024-02-06 MED ORDER — PROMETHAZINE HCL 25 MG PO TABS
12.5000 mg | ORAL_TABLET | Freq: Once | ORAL | Status: AC
  Administered 2024-02-06: 12.5 mg via ORAL
  Filled 2024-02-06: qty 1

## 2024-02-06 MED ORDER — METHYLERGONOVINE MALEATE 0.2 MG/ML IJ SOLN
INTRAMUSCULAR | Status: AC
  Administered 2024-02-06: 0.2 mg via INTRAMUSCULAR
  Filled 2024-02-06: qty 1

## 2024-02-06 MED ORDER — TRANEXAMIC ACID-NACL 1000-0.7 MG/100ML-% IV SOLN: 1000.0000 mg | INTRAVENOUS | Status: AC

## 2024-02-06 MED ORDER — IBUPROFEN 600 MG PO TABS
600.0000 mg | ORAL_TABLET | Freq: Four times a day (QID) | ORAL | Status: DC
  Administered 2024-02-06 – 2024-02-07 (×4): 600 mg via ORAL
  Filled 2024-02-06 (×4): qty 1

## 2024-02-06 MED ORDER — ZOLPIDEM TARTRATE 5 MG PO TABS: 5.0000 mg | ORAL_TABLET | Freq: Every evening | ORAL | Status: DC | PRN

## 2024-02-06 NOTE — Anesthesia Procedure Notes (Addendum)
 Epidural Patient location during procedure: OB Start time: 02/06/2024 11:32 AM End time: 02/06/2024 11:35 AM  Staffing Anesthesiologist: Lucious Debby BRAVO, MD Performed: anesthesiologist   Preanesthetic Checklist Completed: patient identified, IV checked, risks and benefits discussed, monitors and equipment checked, pre-op evaluation and timeout performed  Epidural Patient position: sitting Prep: DuraPrep Patient monitoring: continuous pulse ox and blood pressure Approach: midline Location: L3-L4 Injection technique: LOR saline  Needle:  Needle type: Tuohy  Needle gauge: 17 G Needle length: 9 cm Needle insertion depth: 4 cm Catheter size: 19 Gauge Catheter at skin depth: 8 cm Test dose: negative and Other (1% lidocaine )  Assessment Events: blood not aspirated and no cerebrospinal fluid  Additional Notes Patient identified. Risks including, but not limited to, bleeding, infection, nerve damage, paralysis, inadequate analgesia, blood pressure changes, nausea, vomiting, allergic reaction, postpartum back pain, itching, and headache were discussed. Patient expressed understanding and wished to proceed. Sterile prep and drape, including hand hygiene, mask, and sterile gloves were used. The patient was positioned and the spine was prepped. The skin was anesthetized with lidocaine . No paraesthesia or other complication noted. The patient did not experience any signs of intravascular injection such as tinnitus or metallic taste in mouth, nor signs of intrathecal spread such as rapid motor block. Please see nursing notes for vital signs. The patient tolerated the procedure well.   Debby Lucious, MDReason for block:procedure for pain

## 2024-02-06 NOTE — Progress Notes (Signed)
 S: Comfortable with epidural. Husband, Matt, present and supportive.   O: Vitals:   02/06/24 1215 02/06/24 1220 02/06/24 1230 02/06/24 1300  BP: 106/66 116/74 110/66 99/60  Pulse: 70 77 70 66  Resp:  18 20 16   Temp:   98 F (36.7 C)   TempSrc:   Oral   SpO2:      Weight:      Height:       FHT:  FHR: 120 bpm, variability: moderate,  accelerations:  Present,  decelerations:  Absent UC:   irregular SVE:   Dilation: 3 Effacement (%): 80 Station: -2 Exam by:: Shakeel Disney  A / P: Induction of labor due to maternal medical conditions and decreased fetal movement at term  Fetal Wellbeing:  Category I GBS: Negative Pain Control:  Epidural Anticipated MOD:  NSVD  Cervix favorable. Will start Pitocin  2x2 until contracting regularly then plan AROM.   Dr. Rendell updated with patient status and plan of care.  Taylor Luna, CNM, MSN 02/06/2024, 1:08 PM

## 2024-02-06 NOTE — Progress Notes (Signed)
 S: Comfortable with epidural. Discussed the R/B/A of AROM for labor induction and patient consents to procedure. Husband, [name], present and supportive.   O: Vitals:   02/06/24 1230 02/06/24 1300 02/06/24 1330 02/06/24 1400  BP: 110/66 99/60 103/63 101/64  Pulse: 70 66 68 73  Resp: 20 16    Temp: 98 F (36.7 C)     TempSrc: Oral     SpO2:      Weight:      Height:       FHT:  FHR: 115 bpm, variability: moderate,  accelerations:  Present,  decelerations:  Absent UC:   regular, every 3-5 minutes SVE:   Dilation: 4 Effacement (%): 90 Station: -1 Exam by: Joshua  AROM of a copious amount of clear fluid at 1400.   A / P: Induction of labor due to maternal medical conditions and decreased fetal movement at term   Fetal Wellbeing:  Category I GBS: Negative Pain Control:  Epidural Anticipated MOD:  NSVD  Continue Pitocin  2x2.   Mildly elevated LFTs, slight increase from last night to this AM. Plan repeat CMP after delivery for LFT trend. Plan urology consult after delivery for management of kidney stones.   Consult with Dr. Barbette.  Alan MARLA Joshua, CNM, MSN 02/06/2024, 2:08 PM

## 2024-02-06 NOTE — Anesthesia Preprocedure Evaluation (Signed)
 Anesthesia Evaluation  Patient identified by MRN, date of birth, ID band Patient awake    Reviewed: Allergy & Precautions, NPO status , Patient's Chart, lab work & pertinent test results  History of Anesthesia Complications Negative for: history of anesthetic complications  Airway Mallampati: I   Neck ROM: Full    Dental   Pulmonary neg pulmonary ROS   Pulmonary exam normal        Cardiovascular negative cardio ROS Normal cardiovascular exam     Neuro/Psych  PSYCHIATRIC DISORDERS Anxiety Depression    negative neurological ROS     GI/Hepatic ,GERD  Medicated and Controlled,, Elevated LFTs    Endo/Other   Ca 7.9   Renal/GU  Kidney stones      Musculoskeletal negative musculoskeletal ROS (+)    Abdominal   Peds  Hematology  (+) Blood dyscrasia, anemia  Plt 209k    Anesthesia Other Findings   Reproductive/Obstetrics (+) Pregnancy                             Anesthesia Physical Anesthesia Plan  ASA: 2  Anesthesia Plan: Epidural   Post-op Pain Management:    Induction:   PONV Risk Score and Plan: 2 and Treatment may vary due to age or medical condition  Airway Management Planned: Natural Airway  Additional Equipment: None  Intra-op Plan:   Post-operative Plan:   Informed Consent: I have reviewed the patients History and Physical, chart, labs and discussed the procedure including the risks, benefits and alternatives for the proposed anesthesia with the patient or authorized representative who has indicated his/her understanding and acceptance.       Plan Discussed with: Anesthesiologist  Anesthesia Plan Comments: (Labs reviewed. Platelets acceptable, patient not taking any blood thinning medications. Per RN, FHR tracing reported to be stable enough for sitting procedure. Risks and benefits discussed with patient, including PDPH, backache, epidural hematoma, failed  epidural, blood pressure changes, allergic reaction, and nerve injury. Patient expressed understanding and wished to proceed.)       Anesthesia Quick Evaluation

## 2024-02-06 NOTE — H&P (Signed)
 OB ADMISSION/ HISTORY & PHYSICAL:  Admission Date: 02/06/2024  7:45 AM  Admit Diagnosis: Encounter for induction of labor [Z34.90]    Taylor Luna is a 43 y.o. female G3P2002 at [redacted]w[redacted]d presenting for induction of labor for decreased fetal movement at term. Patient presented to MAU last night with flank pain, has bilateral kidney stones on U/S, was managed with IV Dilaudid  and IV fluids. Reports returning home with onset of severe pain around 0200, unable to keep down meds or fluids, and reports significantly decreased fetal movement. MFM consulted and recommends IOL for decreased fetal movement. Denies contractions, leaking of fluid, or vaginal bleeding. Husband, Taylor Luna, present and supportive. Eagerly anticipating a baby girl [place].   Prenatal History: [id number]   EDC: 02/19/2024 Prenatal care at [facility] since 10 weeks  Primary: Dr. Gorge, transfer to CNM care  Prenatal course complicated by: Bilateral kidney stones; Right kidney Renal measurements: 11.4 x 6.5 x 5.4 cm = volume: 208 mL. Renal cortical thickness is preserved and cortical echogenicity is normal. Moderate hydronephrosis. 4 mm nonobstructing calculus is noted within the lower pole of the right kidney. 7 mm calculus noted within the interpolar region of the right kidney. No perinephric fluid collections are identified; Left kidney Renal measurements: 11.7 x 5.76.8 cm = volume: 230 mL. Renal cortical thickness is preserved. Cortical echogenicity is normal. Moderate hydronephrosis. Within the dilated proximal left ureter is a rounded nonshadowing dependently layering mural nodule which may represent a nonshadowing calculus, debris, or a mural polypoid mass. This measures 11 mm in greatest dimension. Unable to tolerate PO meds or maintain PO hydration. Reports difficulty voiding with complete emptying.  Elevated LFTs, AST/ALT 47/76 on 6/22, normal BP, normal platelets, history of mild LFT elevation with last admission for kidney  stones at 29 weeks AMA, reassuring ANFTing, BPP 6/8, 2 off for breathing at 37 weeks, repeat 24 hours later 8/8 History of COVID in 3rd trimester History of LEEP, reassuring CL Rh negative, s/p Rhogam at 28 weeks  Prenatal Labs: ABO, Rh:   O NEG Antibody: NEG (04/26 1325) Rubella: Immune (12/12 0000)  RPR: Nonreactive (12/12 0000)  HBsAg: Negative (12/12 0000)  HIV: Non-reactive (12/12 0000)  GBS:   Negative 1 hr Glucola : 139, declined 3hr gtt Genetic Screening: Low risk XX Panorama Ultrasound: normal XX anatomy, posterior placenta, last growth at 36 weeks 6lb 11oz, 69%    Maternal Diabetes: No Genetic Screening: Normal Maternal Ultrasounds/Referrals: Normal Fetal Ultrasounds or other Referrals:  None Maternal Substance Abuse:  No Significant Maternal Medications:  None Significant Maternal Lab Results:  Group B Strep negative and Rh negative Other Comments:  None  Medical / Surgical History : Past medical history:  Past Medical History:  Diagnosis Date   Adnexal mass    Carcinoma in situ of cervix uteri    Cholecystitis 09/2018   Depression with anxiety    Dizziness    Hx of condyloma acuminatum    Pregnancy 2008   second pregnancy Jan 2020   Vaginal Pap smear, abnormal     Past surgical history:  Past Surgical History:  Procedure Laterality Date   broken nose  2008   surgery to repair   CHOLECYSTECTOMY N/A 10/11/2018   Procedure: LAPAROSCOPIC CHOLECYSTECTOMY;  Surgeon: Debby Hila, MD;  Location: [facility];  Service: General;  Laterality: N/A;   LEEP      Family History:  Family History  Problem Relation Age of Onset   Lupus Mother    Hypertension Father  Diabetes Maternal Grandmother    Cancer Maternal Grandmother    Cancer Maternal Grandfather    Hypertension Paternal Grandmother     Social History:  reports that she has never smoked. She has never used smokeless tobacco. She reports that she does not drink alcohol and does not use  drugs.  Allergies: Erythromycin and Cefdinir   Current Medications at time of admission:  Medications Prior to Admission  Medication Sig Dispense Refill Last Dose/Taking   acetaminophen  (TYLENOL ) 325 MG tablet Take 2 tablets (650 mg total) by mouth every 6 (six) hours as needed for mild pain (or temp > 100). 30 tablet 0 02/05/2024   docusate sodium  (COLACE) 100 MG capsule Take 1 capsule (100 mg total) by mouth 2 (two) times daily as needed for mild constipation. 10 capsule 0 02/05/2024   ondansetron  (ZOFRAN  ODT) 4 MG disintegrating tablet Take 1 tablet (4 mg total) by mouth every 8 (eight) hours as needed for nausea or vomiting. 20 tablet 0 Past Week   ondansetron  (ZOFRAN -ODT) 4 MG disintegrating tablet Take 1 tablet (4 mg total) by mouth every 6 (six) hours as needed for nausea. 20 tablet 0 Past Week   oxyCODONE  (ROXICODONE ) 5 MG immediate release tablet Take 1 tablet (5 mg total) by mouth every 4 (four) hours as needed for severe pain (pain score 7-10). 30 tablet 0 02/06/2024 at  6:30 AM   promethazine  (PHENERGAN ) 12.5 MG tablet Take 1 tablet (12.5 mg total) by mouth every 6 (six) hours as needed for nausea or vomiting. 30 tablet 0 02/06/2024 at  6:30 AM   aspirin  EC 81 MG tablet Take 81 mg by mouth daily. Swallow whole.      calcium  carbonate (TUMS - DOSED IN MG ELEMENTAL CALCIUM ) 500 MG chewable tablet Chew 1 tablet by mouth 2 (two) times daily as needed for indigestion or heartburn.      famotidine  (PEPCID ) 20 MG tablet Take 1 tablet (20 mg total) by mouth 2 (two) times daily. (Patient taking differently: Take 20 mg by mouth daily as needed for heartburn or indigestion.) 30 tablet 0    gabapentin  (NEURONTIN ) 300 MG capsule Take 1 capsule (300 mg total) by mouth 3 (three) times daily. 90 capsule 0 More than a month   Prenat-Fe Carbonyl-FA-Omega 3 (ONE-A-DAY WOMENS PRENATAL 1) 28-0.8-235 MG CAPS Take 1 capsule by mouth daily.      promethazine  (PHENERGAN ) 12.5 MG tablet Take 1 tablet (12.5 mg  total) by mouth every 6 (six) hours as needed for nausea or vomiting. 30 tablet 0    tamsulosin  (FLOMAX ) 0.4 MG CAPS capsule Take 1 capsule (0.4 mg total) by mouth daily. 5 capsule 0 More than a month   tamsulosin  (FLOMAX ) 0.4 MG CAPS capsule Take 1 capsule (0.4 mg total) by mouth daily. 30 capsule 0 More than a month    Review of Systems: Review of Systems  Constitutional:  Negative for chills and fever.  Gastrointestinal:  Positive for nausea and vomiting.  Genitourinary:  Positive for dysuria and flank pain.  Musculoskeletal:  Positive for back pain.   Physical Exam: Vital signs and nursing notes reviewed.  Patient Vitals for the past 24 hrs:  BP Temp Temp src Pulse Resp SpO2 Height Weight  02/06/24 0826 117/78 97.9 F (36.6 C) Oral 93 18 99 % -- --  02/06/24 0824 -- -- -- -- -- 99 % -- --  02/06/24 0804 122/82 97.8 F (36.6 C) Oral 91 18 100 % -- --  02/06/24 0756 -- -- -- -- -- --  5' 3 (1.6 m) 57.7 kg    General: AAO x 3, NAD Heart: RRR Lungs:CTAB Abdomen: Gravid, NT Extremities: no edema SVE:   deferred until epidural placement  FHR: 135BPM, moderate variability, + accels, no decels TOCO: Contractions occasional  Labs:   Results for orders placed or performed during the hospital encounter of 02/05/24 (from the past 24 hours)  Urinalysis, Routine w reflex microscopic -Urine, Clean Catch     Status: Abnormal   Collection Time: 02/05/24  6:23 PM  Result Value Ref Range   Color, Urine YELLOW YELLOW   APPearance CLEAR CLEAR   Specific Gravity, Urine 1.008 1.005 - 1.030   pH 6.0 5.0 - 8.0   Glucose, UA NEGATIVE NEGATIVE mg/dL   Hgb urine dipstick MODERATE (A) NEGATIVE   Bilirubin Urine NEGATIVE NEGATIVE   Ketones, ur NEGATIVE NEGATIVE mg/dL   Protein, ur NEGATIVE NEGATIVE mg/dL   Nitrite NEGATIVE NEGATIVE   Leukocytes,Ua SMALL (A) NEGATIVE   RBC / HPF 6-10 0 - 5 RBC/hpf   WBC, UA 6-10 0 - 5 WBC/hpf   Bacteria, UA NONE SEEN NONE SEEN   Squamous Epithelial / HPF 0-5  0 - 5 /HPF   Hyaline Casts, UA PRESENT   CBC with Differential/Platelet     Status: Abnormal   Collection Time: 02/05/24  6:50 PM  Result Value Ref Range   WBC 12.8 (H) 4.0 - 10.5 K/uL   RBC 3.29 (L) 3.87 - 5.11 MIL/uL   Hemoglobin 11.1 (L) 12.0 - 15.0 g/dL   HCT 67.5 (L) 63.9 - 53.9 %   MCV 98.5 80.0 - 100.0 fL   MCH 33.7 26.0 - 34.0 pg   MCHC 34.3 30.0 - 36.0 g/dL   RDW 86.7 88.4 - 84.4 %   Platelets 232 150 - 400 K/uL   nRBC 0.0 0.0 - 0.2 %   Neutrophils Relative % 77 %   Neutro Abs 9.8 (H) 1.7 - 7.7 K/uL   Lymphocytes Relative 15 %   Lymphs Abs 1.9 0.7 - 4.0 K/uL   Monocytes Relative 6 %   Monocytes Absolute 0.8 0.1 - 1.0 K/uL   Eosinophils Relative 0 %   Eosinophils Absolute 0.0 0.0 - 0.5 K/uL   Basophils Relative 0 %   Basophils Absolute 0.0 0.0 - 0.1 K/uL   Immature Granulocytes 2 %   Abs Immature Granulocytes 0.25 (H) 0.00 - 0.07 K/uL  Comprehensive metabolic panel     Status: Abnormal   Collection Time: 02/05/24  6:50 PM  Result Value Ref Range   Sodium 135 135 - 145 mmol/L   Potassium 3.1 (L) 3.5 - 5.1 mmol/L   Chloride 105 98 - 111 mmol/L   CO2 18 (L) 22 - 32 mmol/L   Glucose, Bld 86 70 - 99 mg/dL   BUN 7 6 - 20 mg/dL   Creatinine, Ser 9.19 0.44 - 1.00 mg/dL   Calcium  8.6 (L) 8.9 - 10.3 mg/dL   Total Protein 5.9 (L) 6.5 - 8.1 g/dL   Albumin 2.7 (L) 3.5 - 5.0 g/dL   AST 47 (H) 15 - 41 U/L   ALT 76 (H) 0 - 44 U/L   Alkaline Phosphatase 162 (H) 38 - 126 U/L   Total Bilirubin 0.7 0.0 - 1.2 mg/dL   GFR, Estimated >39 >39 mL/min   Anion gap 12 5 - 15    Assessment/Plan: 42 y.o. G3P2002 at [redacted]w[redacted]d, IOL for decreased fetal movement at term Bilateral kidney stones, intractable pain with nausea and vomiting AMA,  reassuring ANFTing Elevated LFTs Rh negative, s/p Rhogam at 30 weeks  Fetal wellbeing - FHT category 1 EFW AGA 7-8lbs  Labor: Plan epidural on admission, start Pitocin  2x2, AROM when favorable  GBS negative Rubella immune Rh negative, s/p Rhogam at  30 weeks  Pain control: desires epidural on admission Analgesia/anesthesia PRN  Anticipated MOD: NSVB  Plans to breastfeed. POC discussed with patient and support team, all questions answered.  Dr. Rendell notified of admission/plan of care.  Alan MARLA Molt CNM, MSN 02/06/2024, 8:33 AM

## 2024-02-06 NOTE — MAU Note (Signed)
 Taylor Luna is a 43 y.o. at [redacted]w[redacted]d here in MAU reporting: she's having left lower back and flank pain from previously diagnosed kidney stones.  Reports pain has worsened from previous visit to MAU this morning.  Also reports irregular ctxs.  Denies LOF or VB.  Endorses +FM, less than usual.  LMP: NA Onset of complaint: today Pain score: 7 Vitals:   02/06/24 0804  BP: 122/82  Pulse: 91  Resp: 18  Temp: 97.8 F (36.6 C)  SpO2: 100%     FHT: 133 bpm  Lab orders placed from triage: None

## 2024-02-07 LAB — COMPREHENSIVE METABOLIC PANEL WITH GFR
ALT: 120 U/L — ABNORMAL HIGH (ref 0–44)
AST: 81 U/L — ABNORMAL HIGH (ref 15–41)
Albumin: 1.9 g/dL — ABNORMAL LOW (ref 3.5–5.0)
Alkaline Phosphatase: 129 U/L — ABNORMAL HIGH (ref 38–126)
Anion gap: 4 — ABNORMAL LOW (ref 5–15)
BUN: 9 mg/dL (ref 6–20)
CO2: 20 mmol/L — ABNORMAL LOW (ref 22–32)
Calcium: 7.5 mg/dL — ABNORMAL LOW (ref 8.9–10.3)
Chloride: 109 mmol/L (ref 98–111)
Creatinine, Ser: 0.8 mg/dL (ref 0.44–1.00)
GFR, Estimated: >60 mL/min (ref >60)
Glucose, Bld: 105 mg/dL — ABNORMAL HIGH (ref 70–99)
Potassium: 3.3 mmol/L — ABNORMAL LOW (ref 3.5–5.1)
Sodium: 133 mmol/L — ABNORMAL LOW (ref 135–145)
Total Bilirubin: 0.7 mg/dL (ref 0.0–1.2)
Total Protein: 4.5 g/dL — ABNORMAL LOW (ref 6.5–8.1)

## 2024-02-07 LAB — CBC
HCT: 27.1 % — ABNORMAL LOW (ref 36.0–46.0)
Hemoglobin: 9.3 g/dL — ABNORMAL LOW (ref 12.0–15.0)
MCH: 34.1 pg — ABNORMAL HIGH (ref 26.0–34.0)
MCHC: 34.3 g/dL (ref 30.0–36.0)
MCV: 99.3 fL (ref 80.0–100.0)
Platelets: 197 10*3/uL (ref 150–400)
RBC: 2.73 MIL/uL — ABNORMAL LOW (ref 3.87–5.11)
RDW: 13.4 % (ref 11.5–15.5)
WBC: 13.4 10*3/uL — ABNORMAL HIGH (ref 4.0–10.5)
nRBC: 0 % (ref 0.0–0.2)

## 2024-02-07 MED ORDER — RHO D IMMUNE GLOBULIN 1500 UNIT/2ML IJ SOSY
300.0000 ug | PREFILLED_SYRINGE | Freq: Once | INTRAMUSCULAR | Status: AC
  Administered 2024-02-07: 300 ug via INTRAVENOUS
  Filled 2024-02-07: qty 2

## 2024-02-07 NOTE — Progress Notes (Signed)
 PPD #1 S/P VAVD  Live born female  Birth Weight: 6 lb 12.3 oz (3070 g) APGAR: 7, 9  Newborn Delivery   Birth date/time: 02/06/2024 18:26:00 Delivery type: Vaginal, Vacuum (Extractor)    Baby name: Shanna  Delivering provider: LINNELL PULLING E  Lacerations: None  Feeding: breast  Pain control at delivery: Epidural  S:  Reports feeling well. Happy with birth experience. No further nausea/vomiting/flank pain since arriving on [facility]. Requests early discharge today.              Tolerating PO/No nausea or vomiting             Bleeding is light             Pain controlled with acetaminophen  and ibuprofen  (OTC)             Up ad lib/ambulatory/voiding without difficulties   O:  A & O x 3, in no apparent distress  VS:  Vitals:   02/06/24 2051 02/06/24 2223 02/07/24 0318 02/07/24 0827  BP: 122/71 117/67 111/67 103/62  Pulse: (!) 58 62 61 70  Resp: 16 16 16 16   Temp: 98.5 F (36.9 C) 98.4 F (36.9 C)    TempSrc: Oral Oral Oral   SpO2: 100% 99% 100% 99%  Weight:      Height:       LABS:  Results for orders placed or performed during the hospital encounter of 02/06/24 (from the past 24 hours)  CBC     Status: Abnormal   Collection Time: 02/07/24  5:44 AM  Result Value Ref Range   WBC 13.4 (H) 4.0 - 10.5 K/uL   RBC 2.73 (L) 3.87 - 5.11 MIL/uL   Hemoglobin 9.3 (L) 12.0 - 15.0 g/dL   HCT 72.8 (L) 63.9 - 53.9 %   MCV 99.3 80.0 - 100.0 fL   MCH 34.1 (H) 26.0 - 34.0 pg   MCHC 34.3 30.0 - 36.0 g/dL   RDW 86.5 88.4 - 84.4 %   Platelets 197 150 - 400 K/uL   nRBC 0.0 0.0 - 0.2 %  Comprehensive metabolic panel     Status: Abnormal   Collection Time: 02/07/24  5:44 AM  Result Value Ref Range   Sodium 133 (L) 135 - 145 mmol/L   Potassium 3.3 (L) 3.5 - 5.1 mmol/L   Chloride 109 98 - 111 mmol/L   CO2 20 (L) 22 - 32 mmol/L   Glucose, Bld 105 (H) 70 - 99 mg/dL   BUN 9 6 - 20 mg/dL   Creatinine, Ser 9.19 0.44 - 1.00 mg/dL   Calcium  7.5 (L) 8.9 - 10.3 mg/dL   Total Protein  4.5 (L) 6.5 - 8.1 g/dL   Albumin 1.9 (L) 3.5 - 5.0 g/dL   AST 81 (H) 15 - 41 U/L   ALT 120 (H) 0 - 44 U/L   Alkaline Phosphatase 129 (H) 38 - 126 U/L   Total Bilirubin 0.7 0.0 - 1.2 mg/dL   GFR, Estimated >39 >39 mL/min   Anion gap 4 (L) 5 - 15  Rh IG workup (includes ABO/Rh)     Status: None (Preliminary result)   Collection Time: 02/07/24  5:44 AM  Result Value Ref Range   Gestational Age(Wks) 38    Fetal Screen NEG    Unit Number [id number]    Blood Component Type RHIG    Unit division 00    Status of Unit ISSUED    Transfusion Status  OK TO TRANSFUSE Performed at [facility], [address], [place], [place] [address]      Blood type: --/--/O NEG (06/23 1007)  Rubella: Immune (12/12 0000)   I&O: I/O last 3 completed shifts: In: 1380.9 [I.V.:1163.5; Other:67.4; IV Piggyback:150] Out: 722 [Urine:550; Blood:172]          No intake/output data recorded.  Gen: AAO x 3, NAD Abdomen: soft, non-tender, non-distended Fundus: firm, non-tender, U-1 Perineum: intact Lochia: small Extremities: no edema, no calf pain or tenderness   A/P:  PPD # 1 43 y.o., [id number]  Principal Problem:   Postpartum care following vaginal delivery 6/23  Doing well - stable status  Routine post partum orders Active Problems:   Vacuum-assisted vaginal delivery   Rh negative, maternal  Baby O POS  Rhogam prior to discharge   Kidney stone complicating pregnancy  Awaiting urology consult  Denies further flank pain since delivery  Voiding well   Elevated liver function tests  Slight elevation in 24 hours, AST/ALT now 81/120  Likely due to nausea and vomiting  All other labs WNL and BP WNL and asymptomatic  Discussed with patient the importance of urology consult today to manage kidney stones and awaiting their recommendations and down trend of LFTs.   Plan of care consult with Dr. Linnell.    Alan MARLA Molt, MSN, CNM 02/07/2024, 9:04 AM

## 2024-02-07 NOTE — Lactation Note (Signed)
 This note was copied from a baby's chart. Lactation Consultation Note  Patient Name: Taylor Luna Date: 02/07/2024 Age:41 hours Reason for consult: Initial assessment;Early term 46-38.6wks Mom was holding baby STS. Had attempted to latch baby but she wasn't interested at this time. Mom stated she had a good feeding at 2100. Suggested attempt to feed every three hrs and w/cues. Newborn feeding habits, behavior, STS, I&O, milk storage reviewed. Mom encouraged to feed baby 8-12 times/24 hours and with feeding cues.  Encouraged mom to try again in 3 hrs or before if cueing. Informed mom LC was here all night would love to come and see latch. Mom stated good to know will call.  Maternal Data Does the patient have breastfeeding experience prior to this delivery?: Yes How long did the patient breastfeed?: 7 months to her now 34 yr old. mom didn't BF her now 45 yr old  Feeding    LATCH Score Latch: Too sleepy or reluctant, no latch achieved, no sucking elicited.  Audible Swallowing: None  Type of Nipple: Everted at rest and after stimulation  Comfort (Breast/Nipple): Soft / non-tender  Hold (Positioning): No assistance needed to correctly position infant at breast.  LATCH Score: 6   Lactation Tools Discussed/Used    Interventions Interventions: Breast feeding basics reviewed;Skin to skin;Breast compression;Support pillows;Education;LC Services brochure;CDC milk storage guidelines  Discharge    Consult Status Consult Status: Follow-up Date: 02/07/24 Follow-up type: In-patient    Taylor Luna G 02/07/2024, 12:37 AM

## 2024-02-07 NOTE — Anesthesia Postprocedure Evaluation (Signed)
 Anesthesia Post Note  Patient: Taylor Luna  Procedure(s) Performed: AN AD HOC LABOR EPIDURAL     Patient location during evaluation: Mother Baby Anesthesia Type: Epidural Level of consciousness: awake and alert and oriented Pain management: satisfactory to patient Vital Signs Assessment: post-procedure vital signs reviewed and stable Respiratory status: respiratory function stable Cardiovascular status: stable Postop Assessment: no headache, no backache, epidural receding, patient able to bend at knees, no signs of nausea or vomiting, adequate PO intake and able to ambulate Anesthetic complications: no   No notable events documented.  Last Vitals:  Vitals:   02/06/24 2223 02/07/24 0318  BP: 117/67 111/67  Pulse: 62 61  Resp: 16 16  Temp: 36.9 C   SpO2: 99% 100%    Last Pain:  Vitals:   02/07/24 0318  TempSrc: Oral  PainSc:    Pain Goal: Patients Stated Pain Goal: 0 (02/06/24 0906)                 PURNELL PORTAL

## 2024-02-07 NOTE — Discharge Summary (Signed)
 OB Discharge Summary  Patient Name: Taylor Luna DOB: 1980/12/02 MRN: [id number]  Date of admission: 02/06/2024 Delivering provider: LINNELL PULLING E  Admitting diagnosis: Encounter for induction of labor [Z34.90] Intrauterine pregnancy: [redacted]w[redacted]d     Secondary diagnosis: Patient Active Problem List   Diagnosis Date Noted   Encounter for induction of labor 02/06/2024   Postpartum care following vaginal delivery 6/23 02/06/2024   Elevated liver function tests 02/06/2024   Kidney stone complicating pregnancy 12/10/2023   Vacuum-assisted vaginal delivery 05/16/2019   Rh negative, maternal 05/16/2019    Date of discharge: 02/07/2024   Discharge diagnosis: Principal Problem:   Postpartum care following vaginal delivery 6/23 Active Problems:   Vacuum-assisted vaginal delivery   Rh negative, maternal   Kidney stone complicating pregnancy   Encounter for induction of labor   Elevated liver function tests                                                            Augmentation: AROM and Pitocin  Pain control: Epidural Laceration:None Complications: None  Hospital course:  Induction of Labor With Vaginal Delivery   43 y.o. yo G3P3003 at [redacted]w[redacted]d was admitted to the hospital 02/06/2024 for induction of labor.  Indication for induction: decreased fetal movement. Patient had an labor course complicated by vacuum assisted vaginal delivery.  Membrane Rupture Time/Date: 2:00 PM,02/06/2024  Delivery Method:Vaginal, Vacuum (Extractor) Operative Delivery:Device used:Kiwi Indication: Fetal indications Episiotomy: None Lacerations:  None Details of delivery can be found in separate delivery note. Patient had a postpartum course complicated by elevated LFTs. Patient is asymptomatic and will follow-up at the office for labs in 2 days. Patient is discharged home 02/07/24.  Newborn Data: Birth date:02/06/2024 Birth time:6:26 PM Gender:Female Living status:Living Apgars:7 ,9  Weight:3070  g  Physical exam  Vitals:   02/07/24 0827 02/07/24 1158 02/07/24 1613 02/07/24 1939  BP: 103/62 97/64 102/65 111/65  Pulse: 70 69 64 66  Resp: 16 17 17 16   Temp:  98 F (36.7 C) 97.8 F (36.6 C) 97.9 F (36.6 C)  TempSrc:  Oral Oral Oral  SpO2: 99% 100% 100% 100%  Weight:      Height:       General: alert and cooperative Lochia: appropriate Uterine Fundus: firm Perineum: intact DVT Evaluation: No evidence of DVT seen on physical exam.  Labs: Lab Results  Component Value Date   WBC 13.4 (H) 02/07/2024   HGB 9.3 (L) 02/07/2024   HCT 27.1 (L) 02/07/2024   MCV 99.3 02/07/2024   PLT 197 02/07/2024      Latest Ref Rng & Units 02/07/2024    5:44 AM  CMP  Glucose 70 - 99 mg/dL 894   BUN 6 - 20 mg/dL 9   Creatinine 9.55 - 8.99 mg/dL 9.19   Sodium 864 - 854 mmol/L 133   Potassium 3.5 - 5.1 mmol/L 3.3   Chloride 98 - 111 mmol/L 109   CO2 22 - 32 mmol/L 20   Calcium  8.9 - 10.3 mg/dL 7.5   Total Protein 6.5 - 8.1 g/dL 4.5   Total Bilirubin 0.0 - 1.2 mg/dL 0.7   Alkaline Phos 38 - 126 U/L 129   AST 15 - 41 U/L 81   ALT 0 - 44 U/L 120       05/16/2019  11:03 PM  Edinburgh Postnatal Depression Scale Screening Tool  I have been able to laugh and see the funny side of things. 0  I have looked forward with enjoyment to things. 0  I have blamed myself unnecessarily when things went wrong. 0  I have been anxious or worried for no good reason. 0  I have felt scared or panicky for no good reason. 0  Things have been getting on top of me. 0  I have been so unhappy that I have had difficulty sleeping. 0  I have felt sad or miserable. 0  I have been so unhappy that I have been crying. 0  The thought of harming myself has occurred to me. 0  Edinburgh Postnatal Depression Scale Total 0      Data saved with a previous flowsheet row definition   Discharge instructions:  per After Visit Summary  After Visit Meds:  Allergies as of 02/07/2024       Reactions   Erythromycin  Nausea And Vomiting   Cefdinir Rash        Medication List     STOP taking these medications    acetaminophen  325 MG tablet Commonly known as: TYLENOL    aspirin  EC 81 MG tablet   calcium  carbonate 500 MG chewable tablet Commonly known as: TUMS - dosed in mg elemental calcium    docusate sodium  100 MG capsule Commonly known as: COLACE   famotidine  20 MG tablet Commonly known as: PEPCID    gabapentin  300 MG capsule Commonly known as: NEURONTIN    ondansetron  4 MG disintegrating tablet Commonly known as: Zofran  ODT   One-A-Day Womens Prenatal 1 28-0.8-235 MG Caps   oxyCODONE  5 MG immediate release tablet Commonly known as: Roxicodone    promethazine  12.5 MG tablet Commonly known as: PHENERGAN    tamsulosin  0.4 MG Caps capsule Commonly known as: FLOMAX        Activity: Advance as tolerated. Pelvic rest for 6 weeks.   Newborn Data: Live born female  Birth Weight: 6 lb 12.3 oz (3070 g) APGAR: 7, 9  Newborn Delivery   Birth date/time: 02/06/2024 18:26:00 Delivery type: Vaginal, Vacuum (Extractor)    Named Shanna Baby Feeding: Breast Disposition:home with mother  Delivery Report:  Review the Delivery Report for details.    Follow up:  Follow-up Information     Joshua Alan POUR, CNM. Schedule an appointment as soon as possible for a visit in 2 day(s).   Specialty: Certified Nurse Midwife Why: For lab work. Contact information: [address] [place] [phone]                Alan POUR Joshua, CNM, MSN 02/07/2024, 9:01 PM

## 2024-02-08 LAB — RH IG WORKUP (INCLUDES ABO/RH)
Fetal Screen: NEGATIVE
Gestational Age(Wks): 38
Unit division: 0
Unit division: 0

## 2024-02-10 ENCOUNTER — Encounter (HOSPITAL_COMMUNITY): Payer: Self-pay | Admitting: Obstetrics and Gynecology

## 2024-02-10 ENCOUNTER — Inpatient Hospital Stay (HOSPITAL_COMMUNITY): Admitting: Anesthesiology

## 2024-02-10 ENCOUNTER — Inpatient Hospital Stay (HOSPITAL_COMMUNITY)
Admission: AD | Admit: 2024-02-10 | Discharge: 2024-02-10 | Disposition: A | Discharge status: Home / Self Care | Attending: Family Medicine | Admitting: Family Medicine

## 2024-02-10 DIAGNOSIS — O894 Spinal and epidural anesthesia-induced headache during the puerperium: Secondary | ICD-10-CM | POA: Insufficient documentation

## 2024-02-10 DIAGNOSIS — O9903 Anemia complicating the puerperium: Secondary | ICD-10-CM | POA: Insufficient documentation

## 2024-02-10 DIAGNOSIS — K219 Gastro-esophageal reflux disease without esophagitis: Secondary | ICD-10-CM | POA: Insufficient documentation

## 2024-02-10 DIAGNOSIS — D649 Anemia, unspecified: Secondary | ICD-10-CM | POA: Insufficient documentation

## 2024-02-10 DIAGNOSIS — O9963 Diseases of the digestive system complicating the puerperium: Secondary | ICD-10-CM | POA: Insufficient documentation

## 2024-02-10 DIAGNOSIS — G971 Other reaction to spinal and lumbar puncture: Secondary | ICD-10-CM

## 2024-02-10 LAB — CBC
HCT: 29.5 % — ABNORMAL LOW (ref 36.0–46.0)
Hemoglobin: 9.8 g/dL — ABNORMAL LOW (ref 12.0–15.0)
MCH: 33.8 pg (ref 26.0–34.0)
MCHC: 33.2 g/dL (ref 30.0–36.0)
MCV: 101.7 fL — ABNORMAL HIGH (ref 80.0–100.0)
Platelets: 270 10*3/uL (ref 150–400)
RBC: 2.9 MIL/uL — ABNORMAL LOW (ref 3.87–5.11)
RDW: 13.4 % (ref 11.5–15.5)
WBC: 8.6 10*3/uL (ref 4.0–10.5)
nRBC: 0 % (ref 0.0–0.2)

## 2024-02-10 LAB — PROTIME-INR
INR: 0.9 (ref 0.8–1.2)
Prothrombin Time: 13.1 s (ref 11.4–15.2)

## 2024-02-10 LAB — COMPREHENSIVE METABOLIC PANEL WITH GFR
ALT: 147 U/L — ABNORMAL HIGH (ref 0–44)
AST: 63 U/L — ABNORMAL HIGH (ref 15–41)
Albumin: 2.5 g/dL — ABNORMAL LOW (ref 3.5–5.0)
Alkaline Phosphatase: 141 U/L — ABNORMAL HIGH (ref 38–126)
Anion gap: 9 (ref 5–15)
BUN: 6 mg/dL (ref 6–20)
CO2: 23 mmol/L (ref 22–32)
Calcium: 8.3 mg/dL — ABNORMAL LOW (ref 8.9–10.3)
Chloride: 109 mmol/L (ref 98–111)
Creatinine, Ser: 0.54 mg/dL (ref 0.44–1.00)
GFR, Estimated: >60 mL/min (ref >60)
Glucose, Bld: 81 mg/dL (ref 70–99)
Potassium: 3.5 mmol/L (ref 3.5–5.1)
Sodium: 141 mmol/L (ref 135–145)
Total Bilirubin: 0.9 mg/dL (ref 0.0–1.2)
Total Protein: 5.8 g/dL — ABNORMAL LOW (ref 6.5–8.1)

## 2024-02-10 LAB — APTT: aPTT: 26 s (ref 24–36)

## 2024-02-10 MED ORDER — LABETALOL HCL 5 MG/ML IV SOLN: 40.0000 mg | INTRAVENOUS | Status: DC | PRN

## 2024-02-10 MED ORDER — NIFEDIPINE 10 MG PO CAPS: 20.0000 mg | ORAL_CAPSULE | ORAL | Status: DC | PRN

## 2024-02-10 MED ORDER — LACTATED RINGERS IV BOLUS
1000.0000 mL | Freq: Once | INTRAVENOUS | Status: AC
  Administered 2024-02-10: 1000 mL via INTRAVENOUS

## 2024-02-10 MED ORDER — LACTATED RINGERS IV BOLUS: 1000.0000 mL | Freq: Once | INTRAVENOUS | Status: DC

## 2024-02-10 MED ORDER — POTASSIUM CHLORIDE CRYS ER 20 MEQ PO TBCR
20.0000 meq | EXTENDED_RELEASE_TABLET | Freq: Every day | ORAL | Status: DC
  Administered 2024-02-10: 20 meq via ORAL
  Filled 2024-02-10: qty 1

## 2024-02-10 MED ORDER — POTASSIUM CHLORIDE CRYS ER 20 MEQ PO TBCR: 20.0000 meq | EXTENDED_RELEASE_TABLET | Freq: Every day | ORAL | 0 refills | Status: DC

## 2024-02-10 MED ORDER — NIFEDIPINE 10 MG PO CAPS: 10.0000 mg | ORAL_CAPSULE | ORAL | Status: DC | PRN

## 2024-02-10 MED ORDER — CYCLOBENZAPRINE HCL 5 MG PO TABS
5.0000 mg | ORAL_TABLET | Freq: Three times a day (TID) | ORAL | Status: DC | PRN
  Administered 2024-02-10: 5 mg via ORAL
  Filled 2024-02-10: qty 1

## 2024-02-10 MED ORDER — ACETAMINOPHEN-CAFFEINE 500-65 MG PO TABS
2.0000 | ORAL_TABLET | Freq: Once | ORAL | Status: AC
  Administered 2024-02-10: 2 via ORAL
  Filled 2024-02-10: qty 2

## 2024-02-10 NOTE — Anesthesia Procedure Notes (Addendum)
 Epidural Patient location during procedure: OB Start time: 02/10/2024 5:23 PM End time: 02/10/2024 5:53 PM  Staffing Anesthesiologist: Darlyn Rush, MD Performed: anesthesiologist   Preanesthetic Checklist Completed: patient identified, IV checked, risks and benefits discussed, monitors and equipment checked, pre-op evaluation and timeout performed  Epidural Patient position: sitting Prep: DuraPrep and site prepped and draped Patient monitoring: heart rate, continuous pulse ox and blood pressure Approach: midline Location: L3-L4 Injection technique: LOR air and LOR saline  Needle:  Needle type: Tuohy  Needle gauge: 17 G Needle length: 9 cm Needle insertion depth: 5 cm Catheter type: closed end flexible Catheter size: 19 Gauge Test dose: negative  Assessment Sensory level: T8 Events: blood not aspirated, no cerebrospinal fluid, injection not painful, no injection resistance, no paresthesia and negative IV test  Additional Notes 30 ml blood drawn in a sterile manner by Rymer, CRNA. Pt given 25ml and back pressure became too great. Pt had complete resolution of headache through the process. VSS. Discharge home when appropriate by primary team.Reason for block:Epidural Blood Patch

## 2024-02-10 NOTE — Anesthesia Preprocedure Evaluation (Addendum)
 Anesthesia Evaluation  Patient identified by MRN, date of birth, ID band Patient awake    Reviewed: Allergy & Precautions, NPO status , Patient's Chart, lab work & pertinent test results  History of Anesthesia Complications Negative for: history of anesthetic complications  Airway Mallampati: I   Neck ROM: Full    Dental   Pulmonary neg pulmonary ROS   Pulmonary exam normal        Cardiovascular negative cardio ROS Normal cardiovascular exam     Neuro/Psych  PSYCHIATRIC DISORDERS Anxiety Depression    negative neurological ROS     GI/Hepatic ,GERD  Medicated and Controlled,, Elevated LFTs    Endo/Other   Ca 7.9   Renal/GU  Kidney stones      Musculoskeletal negative musculoskeletal ROS (+)    Abdominal   Peds  Hematology  (+) Blood dyscrasia, anemia  Plt 209k    Anesthesia Other Findings   Reproductive/Obstetrics (+) Pregnancy                             Anesthesia Physical Anesthesia Plan  ASA: 2  Anesthesia Plan: Epidural   Post-op Pain Management:    Induction:   PONV Risk Score and Plan: 2 and Treatment may vary due to age or medical condition  Airway Management Planned: Natural Airway  Additional Equipment: None  Intra-op Plan:   Post-operative Plan:   Informed Consent: I have reviewed the patients History and Physical, chart, labs and discussed the procedure including the risks, benefits and alternatives for the proposed anesthesia with the patient or authorized representative who has indicated his/her understanding and acceptance.       Plan Discussed with:   Anesthesia Plan Comments: (Pt seen and examined. Complaining of postural headache, primarily frontal and occipital. No vision changes, weakness, numbness or tingling. Has been following liver enzymes with OB. Also on lasix for ankle swelling. I discussed with the patient and husband at bedside that  this is the likely a post dural puncture headache, but it could ultimately be due to the other issues like dehydration or preeclampsia. She has failed typical conservative measures at home (fiorcet, fluids, etc.).  Risks of epidural blood patch explained at length. This includes, but is not limited to, bleeding, infection, reactions to the medications, seizures, damage to surrounding structures, damage to nerves, permanent weakness, numbness, tingling and pain. All patient questions were answered and patient wishes to proceed with EBP.   Pt last ate around 1100 (peanut butter crackers and a banana). Will need to be NPO for 6 hours prior to EBP. Labs to be sent looking at her liver function, platelets and coags.  )       Anesthesia Quick Evaluation

## 2024-02-10 NOTE — MAU Provider Note (Signed)
 MAU Provider Note  Chief Complaint: Headache  SUBJECTIVE HPI: Taylor Luna is a 43 y.o. G3P3003 at post partum day 7 after VAVD who presents to maternity admissions reporting a positional headache 10/10 since delivery, not relieved by Fioricet, Labetalol nor lasix provided at office visit yesterday.  Pain improves when supine. Denies SOB, visual changes, RUQ or epigastric pain, worsening swelling. Breast feeding going ok, feeding during our interview.   Last oral intake around 1100; peanut butter crackers, 1/2 banana, water  Pregnancy c/b kidney stones, elevated LFTs, AMA, COVID in 3rd trimester, Rh negative, Hx of LEEP.   Receives [facility] with [facility].   HPI  Past Medical History:  Diagnosis Date   Adnexal mass    Anemia    Carcinoma in situ of cervix uteri    Cholecystitis 09/2018   Depression with anxiety    Dizziness    Hx of condyloma acuminatum    Pregnancy 2008   second pregnancy Jan 2020   Vaginal Pap smear, abnormal    Past Surgical History:  Procedure Laterality Date   broken nose  2008   surgery to repair   CHOLECYSTECTOMY N/A 10/11/2018   Procedure: LAPAROSCOPIC CHOLECYSTECTOMY;  Surgeon: Debby Hila, MD;  Location: [facility];  Service: General;  Laterality: N/A;   LEEP     Social History   Socioeconomic History   Marital status: Married    Spouse name: Not on file   Number of children: Not on file   Years of education: Not on file   Highest education level: Not on file  Occupational History   Not on file  Tobacco Use   Smoking status: Never   Smokeless tobacco: Never  Vaping Use   Vaping status: Never Used  Substance and Sexual Activity   Alcohol use: No   Drug use: No   Sexual activity: Yes  Other Topics Concern   Not on file  Social History Narrative   Not on file   Social Drivers of Health   Financial Resource Strain: Low Risk  (05/02/2019)   Overall Financial Resource Strain (CARDIA)    Difficulty of Paying Living Expenses: Not  hard at all  Food Insecurity: No Food Insecurity (02/06/2024)   Hunger Vital Sign    Worried About Running Out of Food in the Last Year: Never true    Ran Out of Food in the Last Year: Never true  Transportation Needs: No Transportation Needs (02/06/2024)   PRAPARE - Administrator, Civil Service (Medical): No    Lack of Transportation (Non-Medical): No  Physical Activity: Not on file  Stress: Not on file  Social Connections: Socially Isolated (02/06/2024)   Social Connection and Isolation Panel    Frequency of Communication with Friends and Family: Never    Frequency of Social Gatherings with Friends and Family: Never    Attends Religious Services: Never    Database administrator or Organizations: No    Attends Banker Meetings: Never    Marital Status: Married  Catering manager Violence: Not At Risk (02/06/2024)   Humiliation, Afraid, Rape, and Kick questionnaire    Fear of Current or Ex-Partner: No    Emotionally Abused: No    Physically Abused: No    Sexually Abused: No   No current facility-administered medications on file prior to encounter.   No current outpatient medications on file prior to encounter.   Allergies  Allergen Reactions   Erythromycin Nausea And Vomiting   Cefdinir  Rash    ROS:  Pertinent positives/negatives listed above.  I have reviewed patient's Past Medical Hx, Surgical Hx, Family Hx, Social Hx, medications and allergies.   Physical Exam  Patient Vitals for the past 24 hrs:  BP Temp Pulse Resp  02/10/24 1204 -- 98.1 F (36.7 C) -- --  02/10/24 1202 135/81 -- 81 18   Constitutional: Well-developed, well-nourished female in no acute distress  Cardiovascular: normal rate Respiratory: normal effort GI: Abd soft, non-tender MS: Extremities nontender, no edema, normal ROM Neurologic: Alert and oriented x 4  GU: Neg CVAT.  LAB RESULTS Results for orders placed or performed during the hospital encounter of 02/10/24 (from  the past 24 hours)  Comprehensive metabolic panel     Status: Abnormal   Collection Time: 02/10/24  1:16 PM  Result Value Ref Range   Sodium 141 135 - 145 mmol/L   Potassium 3.5 3.5 - 5.1 mmol/L   Chloride 109 98 - 111 mmol/L   CO2 23 22 - 32 mmol/L   Glucose, Bld 81 70 - 99 mg/dL   BUN 6 6 - 20 mg/dL   Creatinine, Ser 9.45 0.44 - 1.00 mg/dL   Calcium  8.3 (L) 8.9 - 10.3 mg/dL   Total Protein 5.8 (L) 6.5 - 8.1 g/dL   Albumin 2.5 (L) 3.5 - 5.0 g/dL   AST 63 (H) 15 - 41 U/L   ALT 147 (H) 0 - 44 U/L   Alkaline Phosphatase 141 (H) 38 - 126 U/L   Total Bilirubin 0.9 0.0 - 1.2 mg/dL   GFR, Estimated >39 >39 mL/min   Anion gap 9 5 - 15  CBC     Status: Abnormal   Collection Time: 02/10/24  1:16 PM  Result Value Ref Range   WBC 8.6 4.0 - 10.5 K/uL   RBC 2.90 (L) 3.87 - 5.11 MIL/uL   Hemoglobin 9.8 (L) 12.0 - 15.0 g/dL   HCT 70.4 (L) 63.9 - 53.9 %   MCV 101.7 (H) 80.0 - 100.0 fL   MCH 33.8 26.0 - 34.0 pg   MCHC 33.2 30.0 - 36.0 g/dL   RDW 86.5 88.4 - 84.4 %   Platelets 270 150 - 400 K/uL   nRBC 0.0 0.0 - 0.2 %  APTT     Status: None   Collection Time: 02/10/24  1:16 PM  Result Value Ref Range   aPTT 26 24 - 36 seconds  Protime-INR     Status: None   Collection Time: 02/10/24  1:16 PM  Result Value Ref Range   Prothrombin Time 13.1 11.4 - 15.2 seconds   INR 0.9 0.8 - 1.2    --/--/O NEG (06/23 1007)  IMAGING US  RENAL Result Date: 02/05/2024 CLINICAL DATA:  Pregnant.  Flank pain. EXAM: RENAL / URINARY TRACT ULTRASOUND COMPLETE COMPARISON:  12/10/2023 FINDINGS: Right Kidney: Renal measurements: 11.4 x 6.5 x 5.4 cm = volume: 208 mL. Renal cortical thickness is preserved and cortical echogenicity is normal. Moderate hydronephrosis. 4 mm nonobstructing calculus is noted within the lower pole of the right kidney. 7 mm calculus noted within the interpolar region of the right kidney. No perinephric fluid collections are identified. Left Kidney: Renal measurements: 11.7 x 5.76.8 cm =  volume: 230 mL. Renal cortical thickness is preserved. Cortical echogenicity is normal. Moderate hydronephrosis. Within the dilated proximal left ureter is a rounded nonshadowing dependently layering mural nodule which may represent a nonshadowing calculus, debris, or a mural polypoid mass. This measures 11 mm in greatest dimension. Bladder:  Decompressed Other: None. IMPRESSION: 1. Moderate bilateral hydronephrosis. 2. 11 mm rounded nonshadowing mural nodule within the dilated proximal left ureter. This may represent a nonshadowing calculus, debris, or a mural polypoid mass. This could be further assessed with ureteroscopy or postpartum CT or MR urography. 3. Right nonobstructing nephrolithiasis. Electronically Signed   By: Dorethia Molt M.D.   On: 02/05/2024 22:06    MAU Management/MDM: Orders Placed This Encounter  Procedures   Comprehensive metabolic panel   CBC   APTT   Protime-INR   Notify physician (specify) Confirmatory reading of BP> 160/110 15 minutes later   Apply Hypertensive Disorders of Pregnancy Care Plan   Measure blood pressure   Insert peripheral IV   Discharge patient Discharge disposition: 01-Home or Self Care; Discharge patient date: 02/10/2024 At 1800    Meds ordered this encounter  Medications   AND Linked Order Group    NIFEdipine (PROCARDIA) capsule 10 mg    NIFEdipine (PROCARDIA) capsule 20 mg    NIFEdipine (PROCARDIA) capsule 20 mg    labetalol (NORMODYNE) injection 40 mg   acetaminophen -caffeine (EXCEDRIN TENSION HEADACHE) 500-65 MG per tablet 2 tablet   cyclobenzaprine  (FLEXERIL ) tablet 5 mg   potassium chloride  SA (KLOR-CON  M) CR tablet 20 mEq   DISCONTD: lactated ringers  bolus 1,000 mL   lactated ringers  bolus 1,000 mL   potassium chloride  SA (KLOR-CON  M) 20 MEQ tablet    Sig: Take 1 tablet (20 mEq total) by mouth daily.    Dispense:  4 tablet    Refill:  0    Available prenatal records reviewed.  ASSESSMENT 1. Postpartum state   2. Post-dural  puncture headache   Positional intractable headache after epidural, unrelieved with tylenol , ibuprofen , Fioricet - Trial Excedrin Tension and Flexeril ; with no change - consulted anesthesia for blood patch evaluation - anesthesia to perform blood patch 6 hours after last oral intake ~1700 - 1L LR at 142ml/hr while NPO - significant improvement after blood patch; will monitor for 1 hour after procedure; as long as remains well can d/c home.  Patient on Labetalol, Lasix for PP HTN  - normotensive today - CBC, CMP stable  Elevated LFTs since 4/26(during COVID infection); AST 81 > 63, ALT 120 > 147  Hypokalemia 3.3 > 3.5; on Lasix - oral repletion 20 mEq/day x 5 days  PLAN Discharge home with strict return precautions. Continue with routine postpartum care as scheduled  Allergies as of 02/10/2024       Reactions   Erythromycin Nausea And Vomiting   Cefdinir Rash        Medication List     TAKE these medications    potassium chloride  SA 20 MEQ tablet Commonly known as: KLOR-CON  M Take 1 tablet (20 mEq total) by mouth daily. Start taking on: February 11, 2024        Mardy Shropshire, MD [facility] Fellow, Faculty practice [facility], Center for [facility]  02/10/2024  7:07 PM

## 2024-02-10 NOTE — MAU Note (Signed)
 Taylor Luna is a 43 y.o. at Unknown here in MAU reporting: PP  02/06/24 vag delivery. Hs had a headache since delivery. Went home  and had appointment yesterday at office and was given medication to help with headache.(Fioricet, labetalol, lasix) Has not helped.Pain improves with laying down. Was told it was probably a spinal headache sn she might need a blood patch LMP:  Onset of complaint: 3 days Pain score: 10 Vitals:   02/10/24 1202 02/10/24 1204  BP: 135/81   Pulse: 81   Resp: 18   Temp:  98.1 F (36.7 C)     FHT: n/a  Lab orders placed from triage:

## 2024-02-11 ENCOUNTER — Other Ambulatory Visit: Payer: Self-pay | Admitting: Obstetrics and Gynecology

## 2024-02-12 ENCOUNTER — Inpatient Hospital Stay (HOSPITAL_COMMUNITY)

## 2024-02-12 ENCOUNTER — Inpatient Hospital Stay (HOSPITAL_COMMUNITY): Admission: RE | Admit: 2024-02-12 | Source: Home / Self Care | Admitting: Obstetrics & Gynecology

## 2024-02-14 ENCOUNTER — Telehealth (HOSPITAL_COMMUNITY): Payer: Self-pay | Admitting: *Deleted

## 2024-02-14 NOTE — Telephone Encounter (Signed)
 Attempted hospital discharge follow-up call. Left message for patient to return RN call with any questions or concerns. Allean IVAR Carton, RN, 02/14/24, [phone]

## 2024-02-21 NOTE — Progress Notes (Signed)
 Chief Complaint: No chief complaint on file.   History of Present Illness:  Taylor Luna is a 43 y.o. female who is seen in consultation from Cleotilde Planas, MD for evaluation of recent findings of bilateral hydronephrosis and calculus disease on renal ultrasound.   Until April of this year, she never had a history of kidney stones.  She did pass a stone about that time, during the last 2 months of her pregnancy.   She is being treated for endometritis, currently on Keflex.  Past Medical History:  Past Medical History:  Diagnosis Date   Adnexal mass    Anemia    Carcinoma in situ of cervix uteri    Cholecystitis 09/2018   Depression with anxiety    Dizziness    Hx of condyloma acuminatum    Pregnancy 2008   second pregnancy Jan 2020   Vaginal Pap smear, abnormal     Past Surgical History:  Past Surgical History:  Procedure Laterality Date   broken nose  2008   surgery to repair   CHOLECYSTECTOMY N/A 10/11/2018   Procedure: LAPAROSCOPIC CHOLECYSTECTOMY;  Surgeon: Debby Hila, MD;  Location: [facility];  Service: General;  Laterality: N/A;   LEEP      Allergies:  Allergies  Allergen Reactions   Erythromycin Nausea And Vomiting   Cefdinir Rash    Family History:  Family History  Problem Relation Age of Onset   Lupus Mother    Hypertension Father    Diabetes Maternal Grandmother    Cancer Maternal Grandmother    Cancer Maternal Grandfather    Hypertension Paternal Grandmother     Social History:  Social History   Tobacco Use   Smoking status: Never   Smokeless tobacco: Never  Vaping Use   Vaping status: Never Used  Substance Use Topics   Alcohol use: No   Drug use: No    Review of symptoms:  Constitutional:  Negative for unexplained weight loss, night sweats, fever, chills ENT:  Negative for nose bleeds, sinus pain, painful swallowing CV:  Negative for chest pain, shortness of breath, exercise intolerance, palpitations, loss of  consciousness Resp:  Negative for cough, wheezing, shortness of breath GI:  Negative for nausea, vomiting, diarrhea, bloody stools GU:  Positives noted in HPI; otherwise negative for gross hematuria, dysuria, urinary incontinence Neuro:  Negative for seizures, poor balance, limb weakness, slurred speech Psych:  Negative for lack of energy, depression, anxiety Endocrine:  Negative for polydipsia, polyuria, symptoms of hypoglycemia (dizziness, hunger, sweating) Hematologic:  Negative for anemia, purpura, petechia, prolonged or excessive bleeding, use of anticoagulants  Allergic:  Negative for difficulty breathing or choking as a result of exposure to anything; no shellfish allergy; no allergic response (rash/itch) to materials, foods  Physical exam: There were no vitals taken for this visit. GENERAL APPEARANCE:  Well appearing, well developed, well nourished, NAD HEENT: Atraumatic, Normocephalic. NECK: Normal appearance LUNGS: Normal inspiratory and expiratory excursion HEART: Regular Rate EXTREMITIES: Moves all extremities well.  Without clubbing, cyanosis, or edema. NEUROLOGIC:  Alert and oriented x 3, normal gait, CN II-XII grossly intact.  MENTAL STATUS:  Appropriate. SKIN:  Warm, dry and intact.    Results:  I have reviewed referring/prior physicians records  I have reviewed urinalysis--microscopic hematuria present  I have reviewed prior urine cultures  I reviewed prior imaging studies--ultrasound images reviewed.  Findings: 1. Moderate bilateral hydronephrosis. 2. 11 mm rounded nonshadowing mural nodule within the dilated proximal left ureter. This may represent a nonshadowing  calculus, debris, or a mural polypoid mass. This could be further assessed with ureteroscopy or postpartum CT or MR urography. 3. Right nonobstructing nephrolithiasis.    Assessment: 1.  Bilateral hydronephrosis.  The right sided hydronephrosis which was detected prior to delivery, may well be that  related to pregnancy.  She possibly had a left ureteral stone at that time  2.  Bilateral nephrolithiasis    Plan: 1.  I will send her out for a stat CT stone protocol today  2.  Call with results, as well as follow-up.  More than likely, she will need management of the left sided stone.

## 2024-02-22 ENCOUNTER — Ambulatory Visit: Admitting: Urology

## 2024-02-22 ENCOUNTER — Ambulatory Visit (HOSPITAL_BASED_OUTPATIENT_CLINIC_OR_DEPARTMENT_OTHER)
Admission: RE | Admit: 2024-02-22 | Discharge: 2024-02-22 | Disposition: A | Discharge status: Home / Self Care | Source: Ambulatory Visit | Attending: Urology | Admitting: Urology

## 2024-02-22 VITALS — BP 119/79 | HR 66 | Ht 63.0 in | Wt 112.0 lb

## 2024-02-22 DIAGNOSIS — N1330 Unspecified hydronephrosis: Secondary | ICD-10-CM

## 2024-02-22 DIAGNOSIS — N200 Calculus of kidney: Secondary | ICD-10-CM | POA: Insufficient documentation

## 2024-02-22 LAB — URINALYSIS, ROUTINE W REFLEX MICROSCOPIC
Bilirubin, UA: NEGATIVE
Glucose, UA: NEGATIVE
Ketones, UA: NEGATIVE
Nitrite, UA: NEGATIVE
Protein,UA: NEGATIVE
Specific Gravity, UA: 1.02 (ref 1.005–1.030)
Urobilinogen, Ur: 0.2 mg/dL (ref 0.2–1.0)
pH, UA: 6.5 (ref 5.0–7.5)

## 2024-02-22 LAB — MICROSCOPIC EXAMINATION

## 2024-02-23 ENCOUNTER — Telehealth: Payer: Self-pay

## 2024-02-23 NOTE — Telephone Encounter (Signed)
 Spoke with pt in reference to CT results and f/u appt. Appt made. Pt voiced understanding.

## 2024-02-23 NOTE — Telephone Encounter (Signed)
-----   Message from Garnette HERO Dahlstedt sent at 02/22/2024  4:37 PM EDT ----- I called patient once I read her CT image.  It looks like the stone is right at her left UVJ.  She seems to be doing well.  Please set up an appointment for her to see me in a couple of weeks.

## 2024-03-02 ENCOUNTER — Other Ambulatory Visit: Payer: Self-pay | Admitting: Student

## 2024-03-02 DIAGNOSIS — R7989 Other specified abnormal findings of blood chemistry: Secondary | ICD-10-CM

## 2024-03-12 ENCOUNTER — Ambulatory Visit (HOSPITAL_BASED_OUTPATIENT_CLINIC_OR_DEPARTMENT_OTHER)
Admission: RE | Admit: 2024-03-12 | Discharge: 2024-03-12 | Disposition: A | Discharge status: Home / Self Care | Source: Ambulatory Visit | Attending: Urology | Admitting: Urology

## 2024-03-12 ENCOUNTER — Ambulatory Visit: Admitting: Urology

## 2024-03-12 ENCOUNTER — Other Ambulatory Visit: Payer: Self-pay

## 2024-03-12 VITALS — BP 128/87 | HR 70 | Ht 62.0 in | Wt 112.0 lb

## 2024-03-12 DIAGNOSIS — N200 Calculus of kidney: Secondary | ICD-10-CM

## 2024-03-12 DIAGNOSIS — N1330 Unspecified hydronephrosis: Secondary | ICD-10-CM

## 2024-03-12 DIAGNOSIS — N201 Calculus of ureter: Secondary | ICD-10-CM | POA: Diagnosis not present

## 2024-03-12 LAB — URINALYSIS, ROUTINE W REFLEX MICROSCOPIC
Bilirubin, UA: NEGATIVE
Glucose, UA: NEGATIVE
Ketones, UA: NEGATIVE
Leukocytes,UA: NEGATIVE
Nitrite, UA: NEGATIVE
Protein,UA: NEGATIVE
Specific Gravity, UA: >1.03 — ABNORMAL HIGH (ref 1.005–1.030)
Urobilinogen, Ur: 0.2 mg/dL (ref 0.2–1.0)
pH, UA: 5.5 (ref 5.0–7.5)

## 2024-03-12 LAB — MICROSCOPIC EXAMINATION

## 2024-03-12 NOTE — Progress Notes (Signed)
   History of Present Illness: Taylor Luna is a 43 y.o. year old female here for follow-up of left distal ureteral stone.  Initial visit on July 9 following referral for bilateral hydronephrosis and left ureteral stone.  She had just delivered her third child.  Postpartum she had uterine infection as well as the need for a blood patch.  She had a CT stone protocol at that visit showing a 4 x 7 mm left distal ureteral or bladder stone.  Since that time she has not been symptomatic.  Past Medical History:  Diagnosis Date   Adnexal mass    Anemia    Carcinoma in situ of cervix uteri    Cholecystitis 09/2018   Depression with anxiety    Dizziness    Hx of condyloma acuminatum    Pregnancy 2008   second pregnancy Jan 2020   Vaginal Pap smear, abnormal     Past Surgical History:  Procedure Laterality Date   broken nose  2008   surgery to repair   CHOLECYSTECTOMY N/A 10/11/2018   Procedure: LAPAROSCOPIC CHOLECYSTECTOMY;  Surgeon: Debby Hila, MD;  Location: [facility];  Service: General;  Laterality: N/A;   LEEP      Home Medications:  (Not in a hospital admission)   Allergies:  Allergies  Allergen Reactions   Erythromycin Nausea And Vomiting   Cefdinir Rash    Family History  Problem Relation Age of Onset   Lupus Mother    Hypertension Father    Diabetes Maternal Grandmother    Cancer Maternal Grandmother    Cancer Maternal Grandfather    Hypertension Paternal Grandmother     Social History:  reports that she has never smoked. She has never used smokeless tobacco. She reports that she does not drink alcohol and does not use drugs.  ROS: A complete review of systems was performed.  All systems are negative except for pertinent findings as noted.  Physical Exam:  Vital signs in last 24 hours: @VSRANGES @ General:  Alert and oriented, No acute distress HEENT: Normocephalic, atraumatic Neck: No JVD or lymphadenopathy Cardiovascular: Regular rate  Lungs: Normal  inspiratory/expiratory excursion Extremities: No edema Neurologic: Grossly intact  I have reviewed prior pt notes  I have reviewed urinalysis results  I have independently reviewed prior imaging--KUB from today reveals a 6 x 9 mm stone at the area of the left UVJ.  We reviewed the image from today's film as well as prior CT scan.  CT scan showed no renal calculi.   Impression/Assessment:  Persistent moderate/large left distal ureteral stone Hounsfield units-1100 Skin to stone distance-8 cm anteriorly, 9.5 cm posteriorly  Plan:  The patient would like to have her stone managed.  We did discuss continued MAT, ureteroscopic treatment with holmium laser lithotripsy and stent versus lithotripsy.  I discussed expected treatment success rate as well as risks and complications.  She would like to go with shockwave lithotripsy..  We will work on getting this scheduled.  Garnette HERO Fidencia Mccloud 03/12/2024, 11:52 AM  Garnette HERO. Shephanie Romas MD

## 2024-03-14 NOTE — Progress Notes (Signed)
 Left voicemail to call back to [phone], and I will try to call back a little later today and  /or tomorrow.

## 2024-03-14 NOTE — Progress Notes (Signed)
 Spoke with patient and she is unaware that she has been scheduled for ESWL on Monday. She states that she will need to call [facility] to reschedule. Informed her that WE will look at the schedule again tomorrow to see when she is scheduled and instruct her from there. She is in agreement.

## 2024-03-15 ENCOUNTER — Encounter (HOSPITAL_COMMUNITY): Payer: Self-pay | Admitting: Urology

## 2024-03-15 ENCOUNTER — Telehealth: Payer: Self-pay | Admitting: Urology

## 2024-03-15 NOTE — Progress Notes (Signed)
 Spoke w/ via phone for pre-op interview-Patient Lab needs dos-KUB, UPT       Lab results------ COVID test -Not indicated--patient states asymptomatic no test needed Arrive at ---1400 May have light breakfast and liquids until---0900 Monday Pre-Surgery Ensure or G2:  Med rec completed Medications to take morning of surgery -Normadyne prn Diabetic medication ---none  GLP1 agonist last dose: GLP1 instructions:  Patient instructed no nail polish to be worn day of surgery Patient instructed to bring photo id and insurance card day of surgery Patient aware to have Driver (ride ) / caregiver    for 24 hours after surgery - spouseGLENWOOD Kid [phone] Patient Special Instructions --Bring blue folder. On Sunday, take laxative of choice, hydrate well and eat light meal that evening. Do not wear metal from waist down (including zippers, snaps, belt buckles), and no flip flops, sandals, crocs Pre-Op special Instructions --per Norita Dustman / Litho  Patient verbalized understanding of instructions that were given at this phone interview. Patient denies chest pain, sob, fever, cough at the interview.

## 2024-03-15 NOTE — Telephone Encounter (Signed)
 Patient needs to cancel her Monday lithotripsy, said no one told her she was scheduled for monday

## 2024-03-15 NOTE — Telephone Encounter (Signed)
[phone]  

## 2024-03-16 ENCOUNTER — Encounter (HOSPITAL_COMMUNITY): Payer: Self-pay | Admitting: Certified Registered Nurse Anesthetist

## 2024-03-16 NOTE — Telephone Encounter (Signed)
 Pt came to sign paperwork. Also spoke with Rosaline and pt is still on for Monday.

## 2024-03-19 ENCOUNTER — Other Ambulatory Visit: Payer: Self-pay

## 2024-03-19 ENCOUNTER — Encounter (HOSPITAL_COMMUNITY): Payer: Self-pay | Admitting: Urology

## 2024-03-19 ENCOUNTER — Ambulatory Visit (HOSPITAL_COMMUNITY)
Admission: RE | Admit: 2024-03-19 | Discharge: 2024-03-19 | Disposition: A | Discharge status: Home / Self Care | Attending: Urology | Admitting: Urology

## 2024-03-19 ENCOUNTER — Telehealth: Payer: Self-pay

## 2024-03-19 ENCOUNTER — Encounter (HOSPITAL_COMMUNITY)
Admission: RE | Disposition: A | Discharge status: Home / Self Care | Payer: Self-pay | Source: Home / Self Care | Attending: Urology

## 2024-03-19 ENCOUNTER — Ambulatory Visit (HOSPITAL_COMMUNITY)

## 2024-03-19 DIAGNOSIS — N202 Calculus of kidney with calculus of ureter: Secondary | ICD-10-CM | POA: Insufficient documentation

## 2024-03-19 DIAGNOSIS — N201 Calculus of ureter: Secondary | ICD-10-CM

## 2024-03-19 DIAGNOSIS — N200 Calculus of kidney: Secondary | ICD-10-CM | POA: Diagnosis present

## 2024-03-19 HISTORY — PX: EXTRACORPOREAL SHOCK WAVE LITHOTRIPSY: SHX1557

## 2024-03-19 LAB — POCT PREGNANCY, URINE: Preg Test, Ur: NEGATIVE

## 2024-03-19 SURGERY — LITHOTRIPSY, ESWL
Anesthesia: Choice | Laterality: Left

## 2024-03-19 MED ORDER — DIAZEPAM 5 MG PO TABS: 10.0000 mg | ORAL_TABLET | ORAL | Status: DC

## 2024-03-19 MED ORDER — ONDANSETRON HCL 4 MG/2ML IJ SOLN: 4.0000 mg | Freq: Once | INTRAMUSCULAR | Status: AC

## 2024-03-19 MED ORDER — SODIUM CHLORIDE 0.9 % IV SOLN: INTRAVENOUS | Status: DC

## 2024-03-19 MED ORDER — ONDANSETRON HCL 4 MG/2ML IJ SOLN
INTRAMUSCULAR | Status: AC
  Administered 2024-03-19: 4 mg via INTRAVENOUS
  Filled 2024-03-19: qty 2

## 2024-03-19 MED ORDER — DIPHENHYDRAMINE HCL 25 MG PO CAPS
25.0000 mg | ORAL_CAPSULE | ORAL | Status: AC
  Administered 2024-03-19: 25 mg via ORAL
  Filled 2024-03-19: qty 1

## 2024-03-19 MED ORDER — CEPHALEXIN 500 MG PO CAPS
500.0000 mg | ORAL_CAPSULE | Freq: Once | ORAL | Status: AC
  Administered 2024-03-19: 500 mg via ORAL
  Filled 2024-03-19: qty 1

## 2024-03-19 MED ORDER — DIAZEPAM 5 MG PO TABS
5.0000 mg | ORAL_TABLET | Freq: Once | ORAL | Status: AC
  Administered 2024-03-19: 5 mg via ORAL
  Filled 2024-03-19: qty 1

## 2024-03-19 SURGICAL SUPPLY — 4 items
BAG COUNTER SPONGE SURGICOUNT (BAG) IMPLANT
COVER SURGICAL LIGHT HANDLE (MISCELLANEOUS) ×1 IMPLANT
PENCIL SMOKE EVACUATOR (MISCELLANEOUS) IMPLANT
TOWEL OR 17X26 10 PK STRL BLUE (TOWEL DISPOSABLE) ×1 IMPLANT

## 2024-03-19 NOTE — Telephone Encounter (Signed)
 Spoke with pt in reference to post op appt. Appt made.

## 2024-03-19 NOTE — H&P (Signed)
 H&P  Chief Complaint: Lt sided kidney stone  History of Present Illness: Taylor Luna is a 43 y.o. year old female here for ESL of a persistently symptomatic left UVJ stone. She became symptomatic late in the 3rd trimester of her recently delivered pregnancy. Most recent KUB last week revealed a 4x7 mm stone still present.  Past Medical History:  Diagnosis Date   Adnexal mass    Anemia    Carcinoma in situ of cervix uteri    Cholecystitis 09/2018   Depression with anxiety    Dizziness    Hx of condyloma acuminatum    Pregnancy 2008   second pregnancy Jan 2020   Vaginal Pap smear, abnormal     Past Surgical History:  Procedure Laterality Date   broken nose  2008   surgery to repair   CHOLECYSTECTOMY N/A 10/11/2018   Procedure: LAPAROSCOPIC CHOLECYSTECTOMY;  Surgeon: Debby Hila, MD;  Location: [facility];  Service: General;  Laterality: N/A;   LEEP     VAGINAL DELIVERY      Home Medications:  No medications prior to admission.    Allergies:  Allergies  Allergen Reactions   Erythromycin Nausea And Vomiting   Cefdinir Rash    Family History  Problem Relation Age of Onset   Lupus Mother    Hypertension Father    Diabetes Maternal Grandmother    Cancer Maternal Grandmother    Cancer Maternal Grandfather    Hypertension Paternal Grandmother     Social History:  reports that she has never smoked. She has never used smokeless tobacco. She reports that she does not drink alcohol and does not use drugs.  ROS: A complete review of systems was performed.  All systems are negative except for pertinent findings as noted.  Physical Exam:  Vital signs in last 24 hours:   General:  Alert and oriented, No acute distress HEENT: Normocephalic, atraumatic Neck: No JVD or lymphadenopathy Cardiovascular: Regular rate  Lungs: Normal inspiratory/expiratory excursion Extremities: No edema Neurologic: Grossly intact  I have reviewed prior pt notes  I have reviewed  urinalysis results  I have independently reviewed prior imaging   Impression/Assessment:  4x7 mm Lt UVJ stone  Plan:  ESL of Lt UVJ stone. This is the first step of a possible staged procedure  Taylor Luna 03/19/2024, 6:22 AM  Taylor CHRISTELLA. Chevelle Durr MD

## 2024-03-19 NOTE — Discharge Instructions (Addendum)
 See [facility] discharge instructions in chart.

## 2024-03-19 NOTE — Telephone Encounter (Signed)
 [facility] requesting return call for f/u appt of ESWL. Orders placed.

## 2024-03-19 NOTE — Op Note (Signed)
See [facility] OP note scanned into chart. 

## 2024-03-20 ENCOUNTER — Encounter (HOSPITAL_COMMUNITY): Payer: Self-pay | Admitting: Urology

## 2024-03-20 ENCOUNTER — Encounter: Payer: Self-pay | Admitting: Urology

## 2024-04-09 ENCOUNTER — Ambulatory Visit: Admitting: Urology

## 2024-04-09 ENCOUNTER — Ambulatory Visit (HOSPITAL_BASED_OUTPATIENT_CLINIC_OR_DEPARTMENT_OTHER)
Admission: RE | Admit: 2024-04-09 | Discharge: 2024-04-09 | Disposition: A | Discharge status: Home / Self Care | Source: Ambulatory Visit | Attending: Urology | Admitting: Urology

## 2024-04-09 DIAGNOSIS — N200 Calculus of kidney: Secondary | ICD-10-CM | POA: Diagnosis present

## 2024-04-13 ENCOUNTER — Telehealth: Payer: Self-pay | Admitting: Urology

## 2024-04-13 NOTE — Telephone Encounter (Signed)
 Spoke with pt in reference to KUB results. Made aware will have Dr. Matilda take a look, but would be next week before nurse knew any information to relay. Pt voiced understanding. Pt also mentioned the no show call. Reinforced with pt we knew she was not coming in for appt and to disregard message. Pt voiced understanding.

## 2024-04-13 NOTE — Telephone Encounter (Signed)
 Pt called and wanted to know if Taylor Luna seen her results of x ray done on 03/19/24 and stated you had called her to tell her not to come to her apt that he would call with her results. She got a phone call from [facility] stating it was a no show she did not understand why that apt was not canceled. Please advise.

## 2024-04-18 NOTE — Telephone Encounter (Signed)
 LMOM making pt aware that stone is fragmented and can follow up as needed.

## 2024-08-21 ENCOUNTER — Other Ambulatory Visit: Payer: Self-pay | Admitting: Family Medicine

## 2024-08-21 DIAGNOSIS — N921 Excessive and frequent menstruation with irregular cycle: Secondary | ICD-10-CM

## 2024-08-29 ENCOUNTER — Other Ambulatory Visit
